# Patient Record
Sex: Male | Born: 1985
Health system: Southern US, Community
[De-identification: ages and names within clinical notes are randomized; demographics above are authoritative.]

## PROBLEM LIST (undated history)

## (undated) DIAGNOSIS — A64 Unspecified sexually transmitted disease: Secondary | ICD-10-CM

## (undated) DIAGNOSIS — B86 Scabies: Secondary | ICD-10-CM

## (undated) HISTORY — PX: SKULL FRACTURE ELEVATION: SHX781

---
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
## 2009-09-25 ENCOUNTER — Emergency Department (HOSPITAL_COMMUNITY): Admission: EM | Admit: 2009-09-25 | Discharge: 2009-09-25 | Payer: Self-pay | Admitting: Family Medicine

## 2009-12-14 ENCOUNTER — Emergency Department (HOSPITAL_COMMUNITY): Admission: EM | Admit: 2009-12-14 | Discharge: 2009-12-14 | Payer: Self-pay | Admitting: Family Medicine

## 2010-03-20 ENCOUNTER — Emergency Department (HOSPITAL_COMMUNITY): Admission: EM | Admit: 2010-03-20 | Discharge: 2010-03-20 | Payer: Self-pay | Admitting: Emergency Medicine

## 2011-01-24 ENCOUNTER — Inpatient Hospital Stay (INDEPENDENT_AMBULATORY_CARE_PROVIDER_SITE_OTHER)
Admission: RE | Admit: 2011-01-24 | Discharge: 2011-01-24 | Disposition: A | Discharge status: Home / Self Care | Payer: Self-pay | Source: Ambulatory Visit | Attending: Family Medicine | Admitting: Family Medicine

## 2011-01-24 DIAGNOSIS — Z0489 Encounter for examination and observation for other specified reasons: Secondary | ICD-10-CM

## 2011-01-24 DIAGNOSIS — R300 Dysuria: Secondary | ICD-10-CM

## 2011-01-25 LAB — GC/CHLAMYDIA PROBE AMP, GENITAL: GC Probe Amp, Genital: NEGATIVE

## 2011-04-05 ENCOUNTER — Inpatient Hospital Stay (INDEPENDENT_AMBULATORY_CARE_PROVIDER_SITE_OTHER)
Admission: RE | Admit: 2011-04-05 | Discharge: 2011-04-05 | Disposition: A | Discharge status: Home / Self Care | Payer: Self-pay | Source: Ambulatory Visit | Attending: Family Medicine | Admitting: Family Medicine

## 2011-04-05 DIAGNOSIS — L259 Unspecified contact dermatitis, unspecified cause: Secondary | ICD-10-CM

## 2011-09-16 ENCOUNTER — Emergency Department (INDEPENDENT_AMBULATORY_CARE_PROVIDER_SITE_OTHER)
Admission: EM | Admit: 2011-09-16 | Discharge: 2011-09-16 | Disposition: A | Discharge status: Home / Self Care | Payer: Self-pay | Source: Home / Self Care | Attending: Emergency Medicine | Admitting: Emergency Medicine

## 2011-09-16 DIAGNOSIS — N342 Other urethritis: Secondary | ICD-10-CM

## 2011-09-16 MED ORDER — AZITHROMYCIN 250 MG PO TABS
ORAL_TABLET | ORAL | Status: AC
  Filled 2011-09-16: qty 4

## 2011-09-16 MED ORDER — CEFTRIAXONE SODIUM 250 MG IJ SOLR
INTRAMUSCULAR | Status: AC
  Filled 2011-09-16: qty 250

## 2011-09-16 MED ORDER — CEFTRIAXONE SODIUM 250 MG IJ SOLR
250.0000 mg | Freq: Once | INTRAMUSCULAR | Status: AC
  Administered 2011-09-16: 250 mg via INTRAMUSCULAR

## 2011-09-16 MED ORDER — AZITHROMYCIN 250 MG PO TABS
1000.0000 mg | ORAL_TABLET | Freq: Once | ORAL | Status: AC
  Administered 2011-09-16: 1000 mg via ORAL

## 2011-09-16 MED ORDER — LIDOCAINE HCL (PF) 1 % IJ SOLN
INTRAMUSCULAR | Status: AC
  Filled 2011-09-16: qty 5

## 2011-09-16 NOTE — ED Provider Notes (Signed)
History     CSN: [id number] Arrival date & time: 09/16/2011  4:35 PM   First MD Initiated Contact with Patient 09/16/11 1537      Chief Complaint  Patient presents with  . Penile Discharge    Pt has penile d/c that started two days ago    (Consider location/radiation/quality/duration/timing/severity/associated sxs/prior treatment) HPI Comments: [name] has had a two-day history of a yellowish urethral discharge. He denies any dysuria or penile pain. He has not had any ulcerations, lesions, or blisters of the penis. He denies any testicular pain, swelling, or mass. He's had no inguinal lymphadenopathy. He denies fever, chills, joint pain, or skin rash. He does have a prior history of STD in the past, he thinks it was Chlamydia. His girlfriend was here a couple days ago and had some testing but he's not sure what that showed.  Patient is a 25 y.o. male presenting with penile discharge.  Penile Discharge Pertinent negatives include no abdominal pain.    History reviewed. No pertinent past medical history.  History reviewed. No pertinent past surgical history.  History reviewed. No pertinent family history.  History  Substance Use Topics  . Smoking status: Smoker, Current Status Unknown  . Smokeless tobacco: Not on file  . Alcohol Use: No      Review of Systems  Constitutional: Negative for fever and chills.  Gastrointestinal: Negative for nausea, vomiting and abdominal pain.  Genitourinary: Positive for discharge. Negative for dysuria, urgency, frequency, hematuria, flank pain, penile swelling, scrotal swelling, genital sores, penile pain and testicular pain.    Allergies  Review of patient's allergies indicates no known allergies.  Home Medications  No current outpatient prescriptions on file.  BP 124/67  Pulse 91  Temp(Src) 98.7 F (37.1 C) (Oral)  Resp 14  SpO2 99%  Physical Exam  Nursing note and vitals reviewed. Constitutional: He appears well-developed and  well-nourished. No distress.  Cardiovascular: Normal rate and regular rhythm.  Exam reveals no gallop and no friction rub.   No murmur heard. Pulmonary/Chest: Effort normal. No respiratory distress. He has no wheezes. He has no rales.  Abdominal: Soft. Bowel sounds are normal. He exhibits no distension and no mass. There is no hepatosplenomegaly. There is no tenderness. There is no rebound, no guarding and no CVA tenderness.  Genitourinary: No penile tenderness.       There is a thick, purulent, yellowish urethral discharge. He had a couple of tiny, nondescript papules on the foreskin, otherwise no genital lesions. No testicular swelling, tenderness, or mass. No inguinal lymphadenopathy.  Skin: He is not diaphoretic.    ED Course  Procedures (including critical care time)  The patient was given the following meds in the [facility]:   Medications  azithromycin (ZITHROMAX) tablet 1,000 mg (1000 mg Oral Given 09/16/11 1718)  cefTRIAXone (ROCEPHIN) injection 250 mg (250 mg Intramuscular Given 09/16/11 1712)     And had the following response:  No allergic reaction.   Labs Reviewed  HIV ANTIBODY (ROUTINE TESTING)  RPR  GC/CHLAMYDIA PROBE AMP, GENITAL   No results found.   1. Urethritis       MDM  This appears to be gonococcal urethritis.        Roque Lias, MD 09/16/11 [phone]

## 2011-09-18 ENCOUNTER — Telehealth (HOSPITAL_COMMUNITY): Payer: Self-pay | Admitting: *Deleted

## 2011-09-18 NOTE — ED Notes (Signed)
[facility] form completed and faxed to [facility]. Dwayne Ramsey 09/19/2011

## 2011-09-19 LAB — GC/CHLAMYDIA PROBE AMP, GENITAL
Chlamydia, DNA Probe: POSITIVE — AB
GC Probe Amp, Genital: POSITIVE — AB

## 2011-09-20 ENCOUNTER — Telehealth (HOSPITAL_COMMUNITY): Payer: Self-pay | Admitting: *Deleted

## 2011-09-20 NOTE — ED Notes (Signed)
[employer]HS forms filled out and faxed to [facility]

## 2013-01-03 ENCOUNTER — Encounter (HOSPITAL_COMMUNITY): Payer: Self-pay | Admitting: Emergency Medicine

## 2013-01-03 ENCOUNTER — Emergency Department (HOSPITAL_COMMUNITY)
Admission: EM | Admit: 2013-01-03 | Discharge: 2013-01-03 | Disposition: A | Discharge status: Home / Self Care | Payer: Self-pay | Attending: Emergency Medicine | Admitting: Emergency Medicine

## 2013-01-03 DIAGNOSIS — Y929 Unspecified place or not applicable: Secondary | ICD-10-CM | POA: Insufficient documentation

## 2013-01-03 DIAGNOSIS — F17200 Nicotine dependence, unspecified, uncomplicated: Secondary | ICD-10-CM | POA: Insufficient documentation

## 2013-01-03 DIAGNOSIS — S39848A Other specified injuries of external genitals, initial encounter: Secondary | ICD-10-CM | POA: Insufficient documentation

## 2013-01-03 DIAGNOSIS — R21 Rash and other nonspecific skin eruption: Secondary | ICD-10-CM | POA: Insufficient documentation

## 2013-01-03 DIAGNOSIS — S3994XA Unspecified injury of external genitals, initial encounter: Secondary | ICD-10-CM | POA: Insufficient documentation

## 2013-01-03 DIAGNOSIS — IMO0002 Reserved for concepts with insufficient information to code with codable children: Secondary | ICD-10-CM | POA: Insufficient documentation

## 2013-01-03 DIAGNOSIS — Y9389 Activity, other specified: Secondary | ICD-10-CM | POA: Insufficient documentation

## 2013-01-03 DIAGNOSIS — T148XXA Other injury of unspecified body region, initial encounter: Secondary | ICD-10-CM

## 2013-01-03 DIAGNOSIS — X58XXXA Exposure to other specified factors, initial encounter: Secondary | ICD-10-CM | POA: Insufficient documentation

## 2013-01-03 DIAGNOSIS — R195 Other fecal abnormalities: Secondary | ICD-10-CM | POA: Insufficient documentation

## 2013-01-03 NOTE — ED Notes (Signed)
PA in room at this time performing assessment  

## 2013-01-03 NOTE — ED Notes (Signed)
Had sex during night and felt some discomfort- this am had increased discomfort when he tried to have sex again . Stated had a scabbed area.  Had no problems before last evening. No blisters, burning, or  discharge.

## 2013-01-03 NOTE — ED Provider Notes (Signed)
History     CSN: [id number]  Arrival date & time 01/03/13  1219   First MD Initiated Contact with Patient 01/03/13 1232      Chief Complaint  Patient presents with  . Penile Discharge    (Consider location/radiation/quality/duration/timing/severity/associated sxs/prior treatment) HPI  Dwayne Ramsey is a 27 y.o. male complaining of lesion to penile shaft noticed this AM. Pt states he had several episodes of vigorous intercourse  And thinks he ripped the skin. He describes a mild discomfort tem the area is palpated but it is otherwise unnoticeable. Denies urethral discharge, abdominal pain, fever.    History reviewed. No pertinent past medical history.  Past Surgical History  Procedure Laterality Date  . Skull fracture elevation      History reviewed. No pertinent family history.  History  Substance Use Topics  . Smoking status: Smoker, Current Status Unknown -- 0.50 packs/day    Types: Cigarettes  . Smokeless tobacco: Not on file  . Alcohol Use: Yes     Comment: occassion      Review of Systems  Constitutional: Negative for fever.  Respiratory: Negative for shortness of breath.   Cardiovascular: Negative for chest pain.  Gastrointestinal: Negative for nausea, vomiting, abdominal pain and diarrhea.  Skin: Positive for rash.  All other systems reviewed and are negative.    Allergies  Review of patient's allergies indicates no known allergies.  Home Medications  No current outpatient prescriptions on file.  BP 122/70  Pulse 76  Temp(Src) 97.4 F (36.3 C) (Oral)  Resp 18  Ht 5\' 7"  (1.702 m)  Wt 150 lb (68.04 kg)  BMI 23.49 kg/m2  SpO2 98%  Physical Exam  Nursing note and vitals reviewed. Constitutional: He is oriented to person, place, and time. He appears well-developed and well-nourished. No distress.  HENT:  Head: Normocephalic.  Eyes: Conjunctivae and EOM are normal.  Cardiovascular: Normal rate.   Pulmonary/Chest: Effort normal and breath  sounds normal. No stridor. No respiratory distress. He has no wheezes. He has no rales. He exhibits no tenderness.  Genitourinary:     0.5 x.75 cm partial thickness abrasion to penile shaft. No erythema, discharge, or induration. Wound is pink and minimally tender.   Musculoskeletal: Normal range of motion.  Lymphadenopathy:       Right: No inguinal adenopathy present.       Left: No inguinal adenopathy present.  Neurological: He is alert and oriented to person, place, and time.  Psychiatric: He has a normal mood and affect.    ED Course  Procedures (including critical care time)  Labs Reviewed - No data to display No results found.   1. Abrasion       MDM   Dwayne Ramsey is a 27 y.o. male with superficial penile abrasion. Advised topical antibiotics. Return precautions for infection or changing lesions.    Filed Vitals:   01/03/13 1242  BP: 122/70  Pulse: 76  Temp: 97.4 F (36.3 C)  TempSrc: Oral  Resp: 18  Height: 5\' 7"  (1.702 m)  Weight: 150 lb (68.04 kg)  SpO2: 98%     Pt verbalized understanding and agrees with care plan. Outpatient follow-up and return precautions given.         Wynetta Emery, PA-C 01/04/13 [phone]

## 2013-01-04 NOTE — ED Provider Notes (Signed)
Medical screening examination/treatment/procedure(s) were performed by non-physician practitioner and as supervising physician I was immediately available for consultation/collaboration.  Nathan R. Pickering, MD 01/04/13 1547 

## 2013-09-04 ENCOUNTER — Emergency Department (HOSPITAL_COMMUNITY)
Admission: EM | Admit: 2013-09-04 | Discharge: 2013-09-04 | Disposition: A | Discharge status: Home / Self Care | Payer: Self-pay | Attending: Emergency Medicine | Admitting: Emergency Medicine

## 2013-09-04 ENCOUNTER — Encounter (HOSPITAL_COMMUNITY): Payer: Self-pay | Admitting: Emergency Medicine

## 2013-09-04 DIAGNOSIS — F17200 Nicotine dependence, unspecified, uncomplicated: Secondary | ICD-10-CM | POA: Insufficient documentation

## 2013-09-04 DIAGNOSIS — B86 Scabies: Secondary | ICD-10-CM | POA: Insufficient documentation

## 2013-09-04 MED ORDER — DIPHENHYDRAMINE HCL 25 MG PO TABS: 25.0000 mg | ORAL_TABLET | Freq: Four times a day (QID) | ORAL | Status: DC

## 2013-09-04 MED ORDER — PERMETHRIN 5 % EX CREA: TOPICAL_CREAM | CUTANEOUS | Status: DC

## 2013-09-04 NOTE — ED Provider Notes (Signed)
Medical screening examination/treatment/procedure(s) were performed by non-physician practitioner and as supervising physician I was immediately available for consultation/collaboration.  EKG Interpretation   None         William Tichina Koebel, MD 09/04/13 1541 

## 2013-09-04 NOTE — ED Notes (Signed)
Pt here with rash to bilateral wrist and hands x 2 weeks that is itching

## 2013-09-04 NOTE — ED Provider Notes (Signed)
CSN: [id number]     Arrival date & time 09/04/13  1224 History  This chart was scribed for non-physician practitioner Fayrene Helper, PA-C working with Dagmar Hait, MD by Leone Payor, ED Scribe. This patient was seen in room TR08C/TR08C and the patient's care was started at 1224.    Chief Complaint  Patient presents with  . Rash    The history is provided by the patient. No language interpreter was used.     HPI Comments: Dwayne Ramsey is a 27 y.o. male who presents to the Emergency Department complaining of 3 weeks of gradual onset, gradually worsening, constant rash to the bilateral hands and forearms. Pt reports having associated itching as well. He states staying in hotel rooms and visiting friends' homes. He has not tried anything for these symptoms. He denies use of new soaps or detergents. He denies new pets or medications.   History reviewed. No pertinent past medical history. Past Surgical History  Procedure Laterality Date  . Skull fracture elevation     History reviewed. No pertinent family history. History  Substance Use Topics  . Smoking status: Smoker, Current Status Unknown -- 0.50 packs/day    Types: Cigarettes  . Smokeless tobacco: Not on file  . Alcohol Use: Yes     Comment: occassion    Review of Systems  HENT: Negative for trouble swallowing.   Respiratory: Negative for shortness of breath.   Skin: Positive for rash.    Allergies  Review of patient's allergies indicates no known allergies.  Home Medications  No current outpatient prescriptions on file. BP 119/71  Pulse 76  Temp(Src) 97.5 F (36.4 C)  SpO2 97% Physical Exam  Nursing note and vitals reviewed. Constitutional: He is oriented to person, place, and time. He appears well-developed and well-nourished.  HENT:  Head: Normocephalic and atraumatic.  No oral mucosa involvement.   Cardiovascular: Normal rate.   Pulmonary/Chest: Effort normal.  Abdominal: He exhibits no distension.   Neurological: He is alert and oriented to person, place, and time.  Skin: Skin is warm and dry.  Several 2mm papillar non-blanchable rash noted in the inter web space of bilateral hands, right wrist and bilateral forearms. Excoriation marks noted. No pustular, vesicular, or petechial rash. No rash involving oral mucosa or palms of hands.   Psychiatric: He has a normal mood and affect.    ED Course  Procedures   DIAGNOSTIC STUDIES: Oxygen Saturation is 97% on RA, normal by my interpretation.    COORDINATION OF CARE: 1:25 PM Discussed with patient that this may be scabies. Advised him to clean all linens and clothes with hot water to ensure destruction of the scabies mites. Discussed treatment plan with pt at bedside and pt agreed to plan.   Labs Review Labs Reviewed - No data to display Imaging Review No results found.  EKG Interpretation   None       MDM   1. Scabies    BP 119/71  Pulse 76  Temp(Src) 97.5 F (36.4 C)  SpO2 97%  I personally performed the services described in this documentation, which was scribed in my presence. The recorded information has been reviewed and is accurate.    Fayrene Helper, PA-C 09/04/13 1333

## 2013-09-12 ENCOUNTER — Encounter (HOSPITAL_COMMUNITY): Payer: Self-pay | Admitting: Emergency Medicine

## 2013-09-12 ENCOUNTER — Emergency Department (HOSPITAL_COMMUNITY)
Admission: EM | Admit: 2013-09-12 | Discharge: 2013-09-12 | Disposition: A | Discharge status: Home / Self Care | Payer: Self-pay | Attending: Emergency Medicine | Admitting: Emergency Medicine

## 2013-09-12 DIAGNOSIS — R21 Rash and other nonspecific skin eruption: Secondary | ICD-10-CM | POA: Insufficient documentation

## 2013-09-12 DIAGNOSIS — Z8619 Personal history of other infectious and parasitic diseases: Secondary | ICD-10-CM | POA: Insufficient documentation

## 2013-09-12 DIAGNOSIS — F17200 Nicotine dependence, unspecified, uncomplicated: Secondary | ICD-10-CM | POA: Insufficient documentation

## 2013-09-12 HISTORY — DX: Scabies: B86

## 2013-09-12 MED ORDER — CEPHALEXIN 500 MG PO CAPS: 500.0000 mg | ORAL_CAPSULE | Freq: Four times a day (QID) | ORAL | Status: DC

## 2013-09-12 MED ORDER — DEXAMETHASONE SODIUM PHOSPHATE 10 MG/ML IJ SOLN
10.0000 mg | Freq: Once | INTRAMUSCULAR | Status: AC
  Administered 2013-09-12: 10 mg via INTRAMUSCULAR
  Filled 2013-09-12: qty 1

## 2013-09-12 NOTE — ED Notes (Signed)
Pt was recently dx with scabies and given px cream; reports he has been using it everyday. Now has multiple bumps on leg and buttock.

## 2013-09-12 NOTE — ED Provider Notes (Signed)
CSN: [id number]     Arrival date & time 09/12/13  1321 History  This chart was scribed for non-physician practitioner, Jamse Mead, PA-C working with Jasper Riling. Alvino Chapel, MD by Roe Coombs, ED Scribe. This patient was seen in room TR10C/TR10C and the patient's care was started at 3:15 PM.    Chief Complaint  Patient presents with  . Rash    The history is provided by the patient. No language interpreter was used.    HPI Comments: Dwayne Ramsey is a 27 y.o. male who presents to the Emergency Department complaining of constant painful non-draining rash to the right upper and medial thigh, abdomen, inguinal area and buttocks onset 2 days ago. Patient has multiple raised, erythematous areas with scabbing to his skin. He describes current pain as severe. He states that pain is exacerbated when his clothes rub against the affected areas. He reports that a few of these bumps formed whiteheads. He denies any itching. He states that he has been applying permethrin cream which was prescribed to him to treat scabies at his last visit. He denies fever, lesions to his hands, or any other symptoms at this time. He is a smoker.    Previous Chart Review Patient was last seen here on 09/04/13 with a 3-week history of a worsening pruritic rash to his bilateral arms and hands. He states that he had been staying with multiple friends and in hotel rooms prior to developing the rash. He was diagnosed with scabies and advised to clean all linens and clothes appropriately to destroy the scabies mites. He was also prescribed permethrin cream to treat rash and Benadryl to relieve itching.   Past Medical History  Diagnosis Date  . Scabies    Past Surgical History  Procedure Laterality Date  . Skull fracture elevation     No family history on file. History  Substance Use Topics  . Smoking status: Smoker, Current Status Unknown -- 0.50 packs/day    Types: Cigarettes  . Smokeless tobacco: Not on file  .  Alcohol Use: Yes     Comment: occassion    Review of Systems  Constitutional: Negative for fever.  Skin: Positive for rash.  All other systems reviewed and are negative.    Allergies  Review of patient's allergies indicates no known allergies.  Home Medications   Current Outpatient Rx  Name  Route  Sig  Dispense  Refill  . diphenhydrAMINE (BENADRYL) 25 MG tablet   Oral   Take 1 tablet (25 mg total) by mouth every 6 (six) hours.   20 tablet   0   . permethrin (ELIMITE) 5 % cream      Apply to entire body from neck down to feet, leave it for 8-10 hrs and then wash it off completely.  Repeat in 1 week if no improvement.   60 g   0   . cephALEXin (KEFLEX) 500 MG capsule   Oral   Take 1 capsule (500 mg total) by mouth 4 (four) times daily.   40 capsule   0    Triage Vitals: BP 136/48  Pulse 95  Temp(Src) 98.2 F (36.8 C) (Oral)  Resp 18  SpO2 100% Physical Exam  Nursing note and vitals reviewed. Constitutional: He is oriented to person, place, and time. He appears well-developed and well-nourished. No distress.  HENT:  Head: Normocephalic and atraumatic.  Mouth/Throat: Oropharynx is clear and moist. No oropharyngeal exudate.  Negative oral lesions noted  Eyes: Conjunctivae and EOM are  normal. Pupils are equal, round, and reactive to light.  Neck: Normal range of motion. Neck supple.  Negative neck stiffness Negative nuchal rigidity Negative cervical LAD  Cardiovascular: Normal rate, regular rhythm and normal heart sounds.  Exam reveals no friction rub.   No murmur heard. Pulses:      Radial pulses are 2+ on the right side, and 2+ on the left side.  Pulmonary/Chest: Effort normal and breath sounds normal. No respiratory distress. He has no wheezes. He has no rales.  Negative respiratory distress Patient able to speak in full sentences without difficulty   Musculoskeletal: Normal range of motion.  Lymphadenopathy:    He has no cervical adenopathy.   Neurological: He is alert and oriented to person, place, and time. He exhibits normal muscle tone. Coordination normal.  Skin: Skin is warm. Rash noted. He is not diaphoretic.  Small scabbed lesions noted to the upper inner aspect of bilateral thighs with erythema surrounding the lesions. Negative pain upon palpation. Negative active drainage. Negative streaking. Negative lesions in the webspaces. Some lesions are small and have pustules at the top - folliculitis.     ED Course  Procedures (including critical care time) DIAGNOSTIC STUDIES: Oxygen Saturation is 100% on room air, normal by my interpretation.    COORDINATION OF CARE: 3:17 PM- Patient informed of current plan for treatment and evaluation and agrees with plan at this time.   Labs Review Labs Reviewed - No data to display Imaging Review No results found.  EKG Interpretation   None       MDM   1. Rash    Medications  dexamethasone (DECADRON) injection 10 mg (10 mg Intramuscular Given 09/12/13 1540)   Filed Vitals:   09/12/13 1333 09/12/13 1558  BP: 136/48 139/89  Pulse: 95 91  Temp: 98.2 F (36.8 C)   TempSrc: Oral   Resp: 18   SpO2: 100% 97%   I personally performed the services described in this documentation, which was scribed in my presence. The recorded information has been reviewed and is accurate.  Patient presenting to the ED with rash to the body. Patient was previous seen last week and diagnosed with scabies.  Alert and oriented. Lungs clear to auscultation bilaterally. Heart rate and rhythm normal. Pulses palpable. Negative lesions noted to the webspaces. Small scabbed lesions localized to the inner upper aspects of bilateral thighs with surrounding erythema - negative active drainage, negative bleeding, negative streaking noted.  Doubt scabies. Doubt erythema multiforme major/minor. Doubt SJS. Possible dermatitis versus folliculitis. Patient stable, afebrile. Discharged patient with antibiotics.  Discussed with patient to continue to monitor symptoms and if symptoms are to worsen or change report back to the ED - strict return instructions given.  Patient agreed to plan of care, understood, all questions answered.   Jamse Mead, PA-C 09/12/13 [phone]

## 2013-09-12 NOTE — ED Notes (Signed)
Pt reports continued itching rash to groin area.

## 2013-09-15 NOTE — ED Provider Notes (Signed)
Medical screening examination/treatment/procedure(s) were performed by non-physician practitioner and as supervising physician I was immediately available for consultation/collaboration.  EKG Interpretation   None        Juliet Rude. Rubin Payor, MD 09/15/13 [phone]

## 2014-12-03 ENCOUNTER — Encounter (HOSPITAL_COMMUNITY): Payer: Self-pay | Admitting: Emergency Medicine

## 2014-12-03 ENCOUNTER — Emergency Department (HOSPITAL_COMMUNITY)
Admission: EM | Admit: 2014-12-03 | Discharge: 2014-12-03 | Disposition: A | Discharge status: Home / Self Care | Payer: Self-pay | Attending: Emergency Medicine | Admitting: Emergency Medicine

## 2014-12-03 DIAGNOSIS — Z792 Long term (current) use of antibiotics: Secondary | ICD-10-CM | POA: Insufficient documentation

## 2014-12-03 DIAGNOSIS — Z8619 Personal history of other infectious and parasitic diseases: Secondary | ICD-10-CM | POA: Insufficient documentation

## 2014-12-03 DIAGNOSIS — R369 Urethral discharge, unspecified: Secondary | ICD-10-CM | POA: Insufficient documentation

## 2014-12-03 DIAGNOSIS — Z202 Contact with and (suspected) exposure to infections with a predominantly sexual mode of transmission: Secondary | ICD-10-CM | POA: Insufficient documentation

## 2014-12-03 DIAGNOSIS — Z720 Tobacco use: Secondary | ICD-10-CM | POA: Insufficient documentation

## 2014-12-03 DIAGNOSIS — Z711 Person with feared health complaint in whom no diagnosis is made: Secondary | ICD-10-CM

## 2014-12-03 MED ORDER — LIDOCAINE HCL (PF) 1 % IJ SOLN
5.0000 mL | Freq: Once | INTRAMUSCULAR | Status: AC
  Administered 2014-12-03: 5 mL
  Filled 2014-12-03: qty 5

## 2014-12-03 MED ORDER — AZITHROMYCIN 250 MG PO TABS
1000.0000 mg | ORAL_TABLET | Freq: Once | ORAL | Status: AC
  Administered 2014-12-03: 1000 mg via ORAL
  Filled 2014-12-03: qty 4

## 2014-12-03 MED ORDER — CEFTRIAXONE SODIUM 250 MG IJ SOLR
250.0000 mg | Freq: Once | INTRAMUSCULAR | Status: AC
  Administered 2014-12-03: 250 mg via INTRAMUSCULAR
  Filled 2014-12-03: qty 250

## 2014-12-03 NOTE — ED Provider Notes (Signed)
CSN: [id number]     Arrival date & time 12/03/14  1904 History  This chart was scribed for non-physician practitioner, Dierdre ForthHannah Sydnei Ohaver, PA-C, working with Richardean Canalavid H Yao, MD, by Bronson CurbJacqueline Melvin, ED Scribe. This patient was seen in room TR10C/TR10C and the patient's care was started at 7:50 PM.     Chief Complaint  Patient presents with  . Penile Discharge    The history is provided by the patient and medical records. No language interpreter was used.     HPI Comments: Dwayne Ramsey is a 29 y.o. male who presents to the Emergency Department for possible STD exposure. Patient reports having unprotected sexual intercourse with a new partner in the last 24 hours and notes associated penile discharge that began today. He also notes subjective fever, but states this is related to a cold and began prior to onset of discharge. He denies testicular pain/swelling, dysuria, abdominal pain, nausea, or vomiting. He reports history of chlamydia and gonorrhea. Patient states his partner has yet to be tested for ans STD, but reports she is to be evaluated tomorrow. He denies history of HIV or syphilis.   Past Medical History  Diagnosis Date  . Scabies    Past Surgical History  Procedure Laterality Date  . Skull fracture elevation     No family history on file. History  Substance Use Topics  . Smoking status: Smoker, Current Status Unknown -- 0.50 packs/day    Types: Cigarettes  . Smokeless tobacco: Not on file  . Alcohol Use: Yes     Comment: occassion    Review of Systems  Constitutional: Negative for fever, diaphoresis, appetite change, fatigue and unexpected weight change.  HENT: Negative for mouth sores.   Eyes: Negative for visual disturbance.  Respiratory: Negative for cough, chest tightness, shortness of breath and wheezing.   Cardiovascular: Negative for chest pain.  Gastrointestinal: Negative for nausea, vomiting, abdominal pain, diarrhea and constipation.  Endocrine: Negative  for polydipsia, polyphagia and polyuria.  Genitourinary: Positive for discharge. Negative for dysuria, urgency, frequency, hematuria, penile swelling, scrotal swelling and testicular pain.  Musculoskeletal: Negative for back pain and neck stiffness.  Skin: Negative for rash.  Allergic/Immunologic: Negative for immunocompromised state.  Neurological: Negative for syncope, light-headedness and headaches.  Hematological: Does not bruise/bleed easily.  Psychiatric/Behavioral: Negative for sleep disturbance. The patient is not nervous/anxious.       Allergies  Review of patient's allergies indicates no known allergies.  Home Medications   Prior to Admission medications   Medication Sig Start Date End Date Taking? Authorizing Provider  cephALEXin (KEFLEX) 500 MG capsule Take 1 capsule (500 mg total) by mouth 4 (four) times daily. 09/12/13   Marissa Sciacca, PA-C  diphenhydrAMINE (BENADRYL) 25 MG tablet Take 1 tablet (25 mg total) by mouth every 6 (six) hours. 09/04/13   Fayrene HelperBowie Tran, PA-C  permethrin (ELIMITE) 5 % cream Apply to entire body from neck down to feet, leave it for 8-10 hrs and then wash it off completely.  Repeat in 1 week if no improvement. 09/04/13   Fayrene HelperBowie Tran, PA-C   Triage Vitals: BP 147/83 mmHg  Pulse 108  Temp(Src) 98.7 F (37.1 C)  Resp 20  SpO2 98%  Physical Exam  Constitutional: He appears well-developed and well-nourished. No distress.  Awake, alert, nontoxic appearance  HENT:  Head: Normocephalic and atraumatic.  Mouth/Throat: Oropharynx is clear and moist. No oropharyngeal exudate.  Eyes: Conjunctivae are normal. No scleral icterus.  Neck: Normal range of motion. Neck supple.  Cardiovascular: Normal rate, regular rhythm and intact distal pulses.   Pulmonary/Chest: Effort normal and breath sounds normal. No respiratory distress. He has no wheezes.  Equal chest expansion  Abdominal: Soft. Bowel sounds are normal. He exhibits no mass. There is no tenderness.  There is no rebound and no guarding. Hernia confirmed negative in the right inguinal area and confirmed negative in the left inguinal area.  Genitourinary: Testes normal. Right testis shows no mass, no swelling and no tenderness. Right testis is descended. Cremasteric reflex is not absent on the right side. Left testis shows no mass, no swelling and no tenderness. Left testis is descended. Cremasteric reflex is not absent on the left side. Circumcised. No phimosis, paraphimosis, hypospadias, penile erythema or penile tenderness. Discharge ( small, clear) found.  Musculoskeletal: Normal range of motion. He exhibits no edema.  Lymphadenopathy:       Right: No inguinal adenopathy present.       Left: No inguinal adenopathy present.  Neurological: He is alert.  Speech is clear and goal oriented Moves extremities without ataxia  Skin: Skin is warm and dry. He is not diaphoretic.  Psychiatric: He has a normal mood and affect.  Nursing note and vitals reviewed.   ED Course  Procedures (including critical care time)  DIAGNOSTIC STUDIES: Oxygen Saturation is 98% on room air, normal by my interpretation.    COORDINATION OF CARE: At 1954 Discussed treatment plan with patient which includes STD screen. Patient agrees.   Labs Review Labs Reviewed  RPR  HIV ANTIBODY (ROUTINE TESTING)  GC/CHLAMYDIA PROBE AMP ([place])    Imaging Review No results found.   EKG Interpretation None      MDM   Final diagnoses:  Penile discharge  Concern about STD in male without diagnosis   Dwayne Ramsey presents with penile discharge and possible exposure to STD.  Patient is afebrile without abdominal tenderness, abdominal pain or painful bowel movements to indicate prostatitis.  No tenderness to palpation of the testes or epididymis to suggest orchitis or epididymitis.  STD cultures obtained including HIV, syphilis, gonorrhea and chlamydia. Patient to be discharged with instructions to follow up  with PCP. Discussed importance of using protection when sexually active. Pt understands that they have GC/Chlamydia cultures pending and that they will need to inform all sexual partners if results return positive. Patient has been treated prophylactically with azithromycin and Rocephin.   I have personally reviewed patient's vitals, nursing note and any pertinent labs or imaging.  I performed an undressed physical exam.    It has been determined that no acute conditions requiring further emergency intervention are present at this time. The patient/guardian have been advised of the diagnosis and plan. I reviewed all labs and imaging including any potential incidental findings. We have discussed signs and symptoms that warrant return to the ED and they are listed in the discharge instructions.    Vital signs are stable at discharge. Tachycardic at triage, but not on physical exam.  BP 147/83 mmHg  Pulse 108  Temp(Src) 98.7 F (37.1 C)  Resp 20  SpO2 98%  I personally performed the services described in this documentation, which was scribed in my presence. The recorded information has been reviewed and is accurate.           Dahlia Client Dequan Kindred, PA-C 12/03/14 2045  Richardean Canal, MD 12/03/14 [phone]

## 2014-12-03 NOTE — Discharge Instructions (Signed)
1. Medications: usual home medications °2. Treatment: rest, drink plenty of fluids, use a condom with every sexual encounter °3. Follow Up: Please followup with your primary doctor in 3 days for discussion of your diagnoses and further evaluation after today's visit; if you do not have a primary care doctor use the resource guide provided to find one; Please return to the ER for worsening symptoms, high fevers or persistent vomiting. ° °You have been tested for HIV, syphilis, chlamydia and gonorrhea.  These results will be available in approximately 3 days.  Please inform all sexual partners if you test positive for any of these diseases. ° ° ° °Emergency Department Resource Guide °1) Find a Doctor and Pay Out of Pocket °Although you won't have to find out who is covered by your insurance plan, it is a good idea to ask around and get recommendations. You will then need to call the office and see if the doctor you have chosen will accept you as a new patient and what types of options they offer for patients who are self-pay. Some doctors offer discounts or will set up payment plans for their patients who do not have insurance, but you will need to ask so you aren't surprised when you get to your appointment. ° °2) Contact Your Local Health Department °Not all health departments have doctors that can see patients for sick visits, but many do, so it is worth a call to see if yours does. If you don't know where your local health department is, you can check in your phone book. The CDC also has a tool to help you locate your state's health department, and many state websites also have listings of all of their local health departments. ° °3) Find a Walk-in Clinic °If your illness is not likely to be very severe or complicated, you may want to try a walk in clinic. These are popping up all over the country in pharmacies, drugstores, and shopping centers. They're usually staffed by nurse practitioners or physician  assistants that have been trained to treat common illnesses and complaints. They're usually fairly quick and inexpensive. However, if you have serious medical issues or chronic medical problems, these are probably not your best option. ° °No Primary Care Doctor: °- Call Health Connect at  832-8000 - they can help you locate a primary care doctor that  accepts your insurance, provides certain services, etc. °- Physician Referral Service- 1-800-533-3463 ° °Chronic Pain Problems: °Organization         Address  Phone   Notes  °[facility]  (336) 297-2271 Patients need to be referred by their primary care doctor.  ° °Medication Assistance: °Organization         Address  Phone   Notes  °[place] [facility] [address] °[place], [place] [address] (336) 641-8030 --Must be a resident of [place] °-- Must have NO insurance coverage whatsoever (no Medicaid/ Medicare, etc.) °-- The pt. MUST have a primary care doctor that directs their care regularly and follows them in the community °  °[facility]  (866) 331-1348   °[employer]  (888) 892-1162   ° °Agencies that provide inexpensive medical care: °Organization         Address  Phone   Notes  °[facility]  (336) 832-8035   ° Internal Medicine    (336) 832-7272   °[facility] [address] °[place], [place] [address] (336)   832-4777   °[facility] [address], °[place] (336) 271-4999   °[facility]    (336) 373-0678   °[facility]    (336) 272-1050   °[facility] ° [address], [place] Phone:  (336) 832-4444, Fax:  (336) 832-4440 Hours of Operation:  9 am - 6 pm, M-F.  Also accepts Medicaid/Medicare and self-pay.  °[facility] ° [address], [place] Phone: (336) 832-3150, Fax: (336) [phone]. Hours of Operation:  8:30 am - 5:30 pm, M-F.  Also accepts  Medicaid and self-pay.  °[facility] [place] [address], [place] Phone: (336) 878-6027   °[facility] [address], [place], [place] (336)723-1848, Ext. 123 Mondays & Thursdays: 7-9 AM.  First 15 patients are seen on a first come, first serve basis. °  ° °Medicaid-accepting [place] Providers: ° °Organization         Address  Phone   Notes  °[facility] [address], [place] (336) 641-2100 Also accepts self-pay patients.  °[facility] [address], [place] ° (336) 856-9996   °[facility] [address], [place] (336) 288-8857   °[facility] [address] [place] [address], [place] (336) 299-7000   °[facility] [address], [place]  ° (336) 373-1557 Only accepts  Access Medicaid patients after they have their name applied to their card.  ° °Self-Pay (no insurance) in [place]: ° °Organization         Address  Phone   Notes  °Sickle Cell Patients, [facility] [address], [place] (336) 832-1970   °[facility] [address], [place] (336) 832-4400   °[facility] ° [address], [place] (336) 992-4800   °[facility]/Dr. Osei-Bonsu ° [address] [place] [address], [place] or [address], [place] (336) 841-8500 Phone number for both [place] and [place] locations is the same.  °[facility] [address], [place] (336) 299-0000   °[facility] [address] [place] [address], [place] or [address] (336) 852-7530 °(336) 878-2260   °[facility] [address], [place] (336) 350-1642, phone; (336) 294-5005, fax Sees patients 1st and 3rd Saturday of every month.  Must not qualify for public or private insurance (i.e. Medicaid, Medicare, [place] Health Choice, Veterans' Benefits) • Household  income should be no more than 200% of the poverty level •The clinic cannot treat you if you are pregnant or think you are pregnant • Sexually transmitted diseases are not treated at the clinic.  ° ° °Dental Care: °Organization         Address  Phone  Notes  °[place] [facility] [address],  (336) 641-6152 Accepts children up to age 21 who are enrolled in Medicaid or [place] Health Choice; pregnant women with a Medicaid card; and children who have applied for Medicaid or [place] Health Choice, but were declined, whose parents can pay a reduced fee at time of service.  °[facility] [place]  [address], [place] (336) 641-7733 Accepts children up to age 21 who are enrolled in Medicaid or  Health Choice; pregnant women with a   Medicaid card; and children who have applied for Medicaid or [place] Health Choice, but were declined, whose parents can pay a reduced fee at time of service.  °[facility] ° [address], [place] (336) 641-4533 Patients are seen by appointment only. Walk-ins are not accepted. [facility] will see patients 18 years of age and older. °Monday - Tuesday (8am-5pm) °Most Wednesdays (8:30-5pm) °$30 per visit, cash only  °[facility] ° [address], [place] (336) 641-4533 Patients are seen by appointment only. Walk-ins are not accepted. [facility] will see patients 18 years of age and older. °One Wednesday Evening (Monthly: Volunteer Based).  $30 per visit, cash only  °[facility]  (919) 537-3737 for adults; Children under age 4, call Graduate Pediatric Dentistry at (919) [phone]. Children aged 4-14, please call (919) [phone] to request a pediatric application. ° Dental services are provided in all areas of dental care including fillings, crowns and bridges, complete and partial dentures, implants, gum  treatment, root canals, and extractions. Preventive care is also provided. Treatment is provided to both adults and children. °Patients are selected via a lottery and there is often a waiting list. °  °[facility] [address], °[place] ° (336) 763-8833 [email or web address] °  °[facility] [address], [place], [place] (336)723-1848, Ext. 123 Second and Fourth Thursday of each month, opens at 6:30 AM; Clinic ends at 9 AM.  Patients are seen on a first-come first-served basis, and a limited number are seen during each clinic.  ° °[facility] ° [address], [place], [place] (336) 723-7904   Eligibility Requirements °You must have lived in [place], [place], or [place] counties for at least the last three months. °  You cannot be eligible for state or federal sponsored healthcare insurance, including Veterans Administration, Medicaid, or Medicare. °  You generally cannot be eligible for healthcare insurance through your employer.  °  How to apply: °Eligibility screenings are held every Tuesday and Wednesday afternoon from 1:00 pm until 4:00 pm. You do not need an appointment for the interview!  °[facility] [address], [place], [place] 336-631-2330   °[facility]  336-342-8273   °[facility]  336-703-3100   °[facility]  336-570-6415   ° °Behavioral Health Resources in the Community: °Intensive Outpatient Programs °Organization         Address  Phone  Notes  °[facility] [address], [place], [place] 336-878-6098   °[facility] [address], [place], [place] 336-832-9800   °[facility] [address], [place], [place] ° 336-882-2125   °[facility] [address],  °, [place] 1-800-853-5163 or 336-641-4981   °Substance Abuse Resources °Organization         Address  Phone  Notes  °[facility]  336-882-2125   °[facility]  336-784-9470   °[facility]  336-285-9073   °[facility]  336-845-3988   °Residential & Outpatient Substance Abuse Program  1-800-659-3381   °Psychological Services °Organization         Address  Phone  Notes  ° Health  336- 832-9600   °[facility]  336- 378-7881   °[facility] [address],  1-800-853-5163 or 336-641-4981   ° °Mobile Crisis Teams °Organization           Address  Phone  Notes  [facility], Mobile Crisis Care Unit  (253)089-5980   [facility]  [address] [place], [place]   [facility] [address] [place] (249)249-7372    Self-Help/Support Groups Organization         Address  Phone             Notes  [place] [place] - variety of support groups  [place] Call for more information  Narcotics Anonymous (NA), [facility] [address], [employer] [place] [place]  2 meetings at this location   Special educational needs teacher         Address  Phone  Notes  [facility] [place],    [place]  1-236-465-4451   [facility]  [address] [place] [address], [place], [place]   [place], [place] (830)401-9842 Admissions: 8am-3pm M-F  [facility] [place] [address]    [place], [place] 151-761-6073   [facility] [address] [place], [place], [place]   [facility] [address]  [place], [place]   [facility] - Intensive Outpatient [address], [place], [place]   [facility] ([place].,  [place], [place] 1-7316549183 or 201-438-0559   [facility] [address], [place], [place] Accepts Medicaid  [facility] [address],  [place] 1-281-729-8089 Substance Abuse/Addiction Treatment   [facility] Organization         Address  Phone  Notes  [facility]  6407101468   Domenic Schwab, PhD [address] [place], [place]   909-847-1489 or 867-309-0760   [place], [place] 269-275-4843   [facility] [address] [place], [place] 609-342-2648 Insurance/Medicaid/sponsorship through [facility] [address]                                    [place], [place] [phone] [place] [address] [place], [place] 339-383-0842    Dr. Adele Schilder  516-089-2527   [facility] Dept. 1) [address], [place] 2) [place] 3)  [address], [place] [phone]  [phone]   [place] (810)[phone] or (941)[phone] (After Hours)

## 2014-12-03 NOTE — ED Notes (Signed)
Pt. reports penile discharge onset today requesting STD screening , denies dysuria / no fever .

## 2014-12-04 LAB — GC/CHLAMYDIA PROBE AMP (~~LOC~~) NOT AT ARMC
CHLAMYDIA, DNA PROBE: POSITIVE — AB
NEISSERIA GONORRHEA: POSITIVE — AB

## 2014-12-05 LAB — RPR: RPR Ser Ql: NONREACTIVE

## 2014-12-07 ENCOUNTER — Telehealth (HOSPITAL_COMMUNITY): Payer: Self-pay

## 2014-12-07 LAB — HIV ANTIBODY (ROUTINE TESTING W REFLEX): HIV Screen 4th Generation wRfx: NONREACTIVE

## 2014-12-07 NOTE — ED Notes (Signed)
Positive for gonorrhea and chlamydia. Treated per protocol. Attempted call x 1

## 2015-01-18 ENCOUNTER — Emergency Department (HOSPITAL_COMMUNITY)
Admission: EM | Admit: 2015-01-18 | Discharge: 2015-01-18 | Disposition: A | Discharge status: Home / Self Care | Payer: Self-pay | Attending: Emergency Medicine | Admitting: Emergency Medicine

## 2015-01-18 ENCOUNTER — Encounter (HOSPITAL_COMMUNITY): Payer: Self-pay | Admitting: Emergency Medicine

## 2015-01-18 DIAGNOSIS — Z720 Tobacco use: Secondary | ICD-10-CM | POA: Insufficient documentation

## 2015-01-18 DIAGNOSIS — A64 Unspecified sexually transmitted disease: Secondary | ICD-10-CM | POA: Insufficient documentation

## 2015-01-18 DIAGNOSIS — Z792 Long term (current) use of antibiotics: Secondary | ICD-10-CM | POA: Insufficient documentation

## 2015-01-18 LAB — GC/CHLAMYDIA PROBE AMP (~~LOC~~) NOT AT ARMC
Chlamydia: NEGATIVE
Neisseria Gonorrhea: POSITIVE — AB

## 2015-01-18 LAB — HIV ANTIBODY (ROUTINE TESTING W REFLEX): HIV SCREEN 4TH GENERATION: NONREACTIVE

## 2015-01-18 MED ORDER — LIDOCAINE HCL (PF) 1 % IJ SOLN
INTRAMUSCULAR | Status: AC
  Administered 2015-01-18: 5 mL
  Filled 2015-01-18: qty 5

## 2015-01-18 MED ORDER — AZITHROMYCIN 250 MG PO TABS
1000.0000 mg | ORAL_TABLET | Freq: Once | ORAL | Status: AC
  Administered 2015-01-18: 1000 mg via ORAL
  Filled 2015-01-18: qty 4

## 2015-01-18 MED ORDER — CEFTRIAXONE SODIUM 250 MG IJ SOLR
250.0000 mg | Freq: Once | INTRAMUSCULAR | Status: AC
  Administered 2015-01-18: 250 mg via INTRAMUSCULAR
  Filled 2015-01-18: qty 250

## 2015-01-18 NOTE — ED Provider Notes (Signed)
CSN: [id number]     Arrival date & time 01/18/15  [id number] History  This chart was scribed for Dwayne CrumbleAdeleke Samanyu Tinnell, MD by Tanda RockersMargaux Venter, ED Scribe. This patient was seen in room A05C/A05C and the patient's care was started at 1:11 AM.    Chief Complaint  Patient presents with  . SEXUALLY TRANSMITTED DISEASE   The history is provided by the patient. No language interpreter was used.     HPI Comments: Dwayne Ramsey is a 29 y.o. male who presents to the Emergency Department requesting STD check. Pt complains of penile discharge that began 2 days ago as well as intermittent dysuria or hematuria. He reports that he had unprotected sex recently but was told by the partner that she had been tested 3 weeks ago and was negative. Pt denies any other symptoms.    Past Medical History  Diagnosis Date  . Scabies    Past Surgical History  Procedure Laterality Date  . Skull fracture elevation     No family history on file. History  Substance Use Topics  . Smoking status: Smoker, Current Status Unknown -- 0.50 packs/day    Types: Cigarettes  . Smokeless tobacco: Not on file  . Alcohol Use: Yes     Comment: occassion    Review of Systems  10 Systems reviewed and all are negative for acute change except as noted in the HPI.    Allergies  Review of patient's allergies indicates no known allergies.  Home Medications   Prior to Admission medications   Medication Sig Start Date End Date Taking? Authorizing Provider  cephALEXin (KEFLEX) 500 MG capsule Take 1 capsule (500 mg total) by mouth 4 (four) times daily. 09/12/13   Marissa Sciacca, PA-C  diphenhydrAMINE (BENADRYL) 25 MG tablet Take 1 tablet (25 mg total) by mouth every 6 (six) hours. 09/04/13   Fayrene HelperBowie Tran, PA-C  permethrin (ELIMITE) 5 % cream Apply to entire body from neck down to feet, leave it for 8-10 hrs and then wash it off completely.  Repeat in 1 week if no improvement. 09/04/13   Fayrene HelperBowie Tran, PA-C   Triage Vitals: BP 123/68 mmHg   Pulse 87  Temp(Src) 98.6 F (37 C) (Oral)  Resp 18  Ht 5\' 8"  (1.727 m)  Wt 150 lb (68.04 kg)  BMI 22.81 kg/m2  SpO2 97%   Physical Exam  Constitutional: He is oriented to person, place, and time. Vital signs are normal. He appears well-developed and well-nourished.  Non-toxic appearance. He does not appear ill. No distress.  HENT:  Head: Normocephalic and atraumatic.  Nose: Nose normal.  Mouth/Throat: Oropharynx is clear and moist. No oropharyngeal exudate.  Eyes: Conjunctivae and EOM are normal. Pupils are equal, round, and reactive to light. No scleral icterus.  Neck: Normal range of motion. Neck supple. No tracheal deviation, no edema, no erythema and normal range of motion present. No thyroid mass and no thyromegaly present.  Cardiovascular: Normal rate, regular rhythm, S1 normal, S2 normal, normal heart sounds, intact distal pulses and normal pulses.  Exam reveals no gallop and no friction rub.   No murmur heard. Pulses:      Radial pulses are 2+ on the right side, and 2+ on the left side.       Dorsalis pedis pulses are 2+ on the right side, and 2+ on the left side.  Pulmonary/Chest: Effort normal and breath sounds normal. No respiratory distress. He has no wheezes. He has no rhonchi. He has no rales.  Abdominal:  Soft. Normal appearance and bowel sounds are normal. He exhibits no distension, no ascites and no mass. There is no hepatosplenomegaly. There is no tenderness. There is no rebound, no guarding and no CVA tenderness.  Genitourinary: Penis normal. No penile tenderness.  No epididymal tenderness  Musculoskeletal: Normal range of motion. He exhibits no edema or tenderness.  Lymphadenopathy:    He has no cervical adenopathy.  Neurological: He is alert and oriented to person, place, and time. He has normal strength. No cranial nerve deficit or sensory deficit.  Skin: Skin is warm, dry and intact. No petechiae and no rash noted. He is not diaphoretic. No erythema. No pallor.   Psychiatric: He has a normal mood and affect. His behavior is normal. Judgment normal.  Nursing note and vitals reviewed.   ED Course  Procedures (including critical care time)  DIAGNOSTIC STUDIES: Oxygen Saturation is 97% on RA, normal by my interpretation.    COORDINATION OF CARE: 1:13 AM-Discussed treatment plan which includes UA, HIV antibody with pt at bedside and pt agreed to plan.   Labs Review Labs Reviewed - No data to display  Imaging Review No results found.   EKG Interpretation None      MDM   Final diagnoses:  None   patient does emergency department for concern for sexual transmitted infection. He states he has had discharge, dysuria as well. He was treated in the emergency department with ceftriaxone and azithromycin. There is no epididymal tenderness on exam. GC and Chlamydia tests were sent in the urine. Patient was advised to have partners treated as well. His vital signs Thelma Barge normal limits and is safe for discharge.  I personally performed the services described in this documentation, which was scribed in my presence. The recorded information has been reviewed and is accurate.     Dwayne Crumble, MD 01/18/15 0130

## 2015-01-18 NOTE — Discharge Instructions (Signed)
Sexually Transmitted Disease Mr. Stasia CavalierSeagraves, you were treated with antibiotics in the emergency department. Follow-up with a primary care physician within 3 days for continued management. Be sure to have partners tested as well. If symptoms worsen come back to the emergency department immediately. Thank you. A sexually transmitted disease (STD) is a disease or infection often passed to another person during sex. However, STDs can be passed through nonsexual ways. An STD can be passed through:  Spit (saliva).  Semen.  Blood.  Mucus from the vagina.  Pee (urine). HOW CAN I LESSEN MY CHANCES OF GETTING AN STD?  Use:  Latex condoms.  Water-soluble lubricants with condoms. Do not use petroleum jelly or oils.  Dental dams. These are small pieces of latex that are used as a barrier during oral sex.  Avoid having more than one sex partner.  Do not have sex with someone who has other sex partners.  Do not have sex with anyone you do not know or who is at high risk for an STD.  Avoid risky sex that can break your skin.  Do not have sex if you have open sores on your mouth or skin.  Avoid drinking too much alcohol or taking illegal drugs. Alcohol and drugs can affect your good judgment.  Avoid oral and anal sex acts.  Get shots (vaccines) for HPV and hepatitis.  If you are at risk of being infected with HIV, it is advised that you take a certain medicine daily to prevent HIV infection. This is called pre-exposure prophylaxis (PrEP). You may be at risk if:  You are a man who has sex with other men (MSM).  You are attracted to the opposite sex (heterosexual) and are having sex with more than one partner.  You take drugs with a needle.  You have sex with someone who has HIV.  Talk with your doctor about if you are at high risk of being infected with HIV. If you begin to take PrEP, get tested for HIV first. Get tested every 3 months for as long as you are taking PrEP. WHAT SHOULD I  DO IF I THINK I HAVE AN STD?  See your doctor.  Tell your sex partner(s) that you have an STD. They should be tested and treated.  Do not have sex until your doctor says it is okay. WHEN SHOULD I GET HELP? Get help right away if:  You have bad belly (abdominal) pain.  You are a man and have puffiness (swelling) or pain in your testicles.  You are a woman and have puffiness in your vagina. Document Released: 11/23/2004 Document Revised: 10/21/2013 Document Reviewed: 04/11/2013 [facility] Patient Information 2015 [employer] [place]. This information is not intended to replace advice given to you by your health care provider. Make sure you discuss any questions you have with your health care provider.

## 2015-01-18 NOTE — ED Notes (Signed)
Pt request STD check.  Reports clear penile drainage x 2 days.  Denies any other symptoms.

## 2015-01-19 ENCOUNTER — Telehealth (HOSPITAL_BASED_OUTPATIENT_CLINIC_OR_DEPARTMENT_OTHER): Payer: Self-pay | Admitting: Emergency Medicine

## 2016-08-20 ENCOUNTER — Encounter (HOSPITAL_COMMUNITY): Payer: Self-pay

## 2016-08-20 ENCOUNTER — Emergency Department (HOSPITAL_COMMUNITY)
Admission: EM | Admit: 2016-08-20 | Discharge: 2016-08-20 | Disposition: A | Discharge status: Home / Self Care | Payer: Self-pay | Attending: Emergency Medicine | Admitting: Emergency Medicine

## 2016-08-20 DIAGNOSIS — R369 Urethral discharge, unspecified: Secondary | ICD-10-CM | POA: Insufficient documentation

## 2016-08-20 DIAGNOSIS — F17210 Nicotine dependence, cigarettes, uncomplicated: Secondary | ICD-10-CM | POA: Insufficient documentation

## 2016-08-20 LAB — URINE MICROSCOPIC-ADD ON: RBC / HPF: NONE SEEN RBC/hpf (ref 0–5)

## 2016-08-20 LAB — URINALYSIS, ROUTINE W REFLEX MICROSCOPIC
BILIRUBIN URINE: NEGATIVE
Glucose, UA: NEGATIVE mg/dL
Hgb urine dipstick: NEGATIVE
Ketones, ur: NEGATIVE mg/dL
NITRITE: NEGATIVE
Protein, ur: NEGATIVE mg/dL
SPECIFIC GRAVITY, URINE: 1.021 (ref 1.005–1.030)
pH: 6.5 (ref 5.0–8.0)

## 2016-08-20 MED ORDER — STERILE WATER FOR INJECTION IJ SOLN
INTRAMUSCULAR | Status: AC
  Administered 2016-08-20: 10 mL
  Filled 2016-08-20: qty 10

## 2016-08-20 MED ORDER — AZITHROMYCIN 250 MG PO TABS
1000.0000 mg | ORAL_TABLET | Freq: Once | ORAL | Status: AC
  Administered 2016-08-20: 1000 mg via ORAL
  Filled 2016-08-20: qty 4

## 2016-08-20 MED ORDER — CEFTRIAXONE SODIUM 250 MG IJ SOLR
250.0000 mg | Freq: Once | INTRAMUSCULAR | Status: AC
  Administered 2016-08-20: 250 mg via INTRAMUSCULAR
  Filled 2016-08-20: qty 250

## 2016-08-20 NOTE — Discharge Instructions (Signed)
Return here as needed.  Follow-up with the health department °

## 2016-08-20 NOTE — ED Triage Notes (Signed)
Patient complains of penile discharge for 2-3 days with mild dysuria. Also concerned with knot to left side of abdomen when he pushes on same, No pain to same

## 2016-08-20 NOTE — ED Notes (Signed)
Declined W/C at D/C and was escorted to lobby by RN. 

## 2016-08-20 NOTE — ED Provider Notes (Signed)
[facility]-EMERGENCY DEPT Provider Note   CSN: [id number] Arrival date & time: 08/20/16  1324  By signing my name below, I, Clovis PuAvnee Patel, attest that this documentation has been prepared under the direction and in the presence of  BoeingChris Debbe Crumble, PA-C. Electronically Signed: Clovis PuAvnee Patel, ED Scribe. 08/20/16. 2:41 PM.   History   Chief Complaint Chief Complaint  Patient presents with  . Penile Discharge    The history is provided by the patient. No language interpreter was used.   HPI Comments:  Dwayne Ramsey is a 30 y.o. male who presents to the Emergency Department complaining of STD exposure. Pt states he recently had unprotected sex and notes associated penile discharge x 2-3 days. He also notes a knot on the left side of his abdomen. Pt denies any pain. No alleviating factors noted. Pt denies any other complaints at this time.   Past Medical History:  Diagnosis Date  . Scabies     There are no active problems to display for this patient.   Past Surgical History:  Procedure Laterality Date  . SKULL FRACTURE ELEVATION      Home Medications    Prior to Admission medications   Medication Sig Start Date End Date Taking? Authorizing Provider  cephALEXin (KEFLEX) 500 MG capsule Take 1 capsule (500 mg total) by mouth 4 (four) times daily. 09/12/13   Marissa Sciacca, PA-C  diphenhydrAMINE (BENADRYL) 25 MG tablet Take 1 tablet (25 mg total) by mouth every 6 (six) hours. 09/04/13   Fayrene HelperBowie Tran, PA-C  permethrin (ELIMITE) 5 % cream Apply to entire body from neck down to feet, leave it for 8-10 hrs and then wash it off completely.  Repeat in 1 week if no improvement. 09/04/13   Fayrene HelperBowie Tran, PA-C    Family History No family history on file.  Social History Social History  Substance Use Topics  . Smoking status: Smoker, Current Status Unknown    Packs/day: 0.50    Types: Cigarettes  . Smokeless tobacco: Not on file  . Alcohol use Yes     Comment: occassion     Allergies     Review of patient's allergies indicates no known allergies.   Review of Systems Review of Systems  Genitourinary: Positive for discharge.  Skin: Negative for rash and wound.     Physical Exam Updated Vital Signs BP 140/77 (BP Location: Right Arm)   Pulse 75   Temp 97.7 F (36.5 C) (Oral)   Resp 18   Ht 5\' 8"  (1.727 m)   Wt 160 lb (72.6 kg)   SpO2 98%   BMI 24.33 kg/m   Physical Exam  Constitutional: He is oriented to person, place, and time. He appears well-developed and well-nourished. No distress.  HENT:  Head: Normocephalic and atraumatic.  Eyes: Pupils are equal, round, and reactive to light.  Neck: Normal range of motion. Neck supple.  Pulmonary/Chest: Effort normal.  Genitourinary: Penis normal.  Genitourinary Comments: Penile discharge. No lesions or rashes.   Neurological: He is alert and oriented to person, place, and time.  Skin: Skin is warm and dry. Capillary refill takes less than 2 seconds.  Nursing note and vitals reviewed.    ED Treatments / Results  DIAGNOSTIC STUDIES:  Oxygen Saturation is 98% on RA, normal by my interpretation.    COORDINATION OF CARE:  2:30 PM Discussed treatment plan with pt at bedside and pt agreed to plan.  Labs (all labs ordered are listed, but only abnormal results are displayed) Labs  Reviewed  URINALYSIS, ROUTINE W REFLEX MICROSCOPIC (NOT AT [facility])    EKG  EKG Interpretation None       Radiology No results found.  Procedures Procedures (including critical care time)  Medications Ordered in ED Medications - No data to display   Initial Impression / Assessment and Plan / ED Course  I have reviewed the triage vital signs and the nursing notes.  Pertinent labs & imaging results that were available during my care of the patient were reviewed by me and considered in my medical decision making (see chart for details).  Clinical Course    Patient treated in the ED for STI with Rocephin and Zithromax  Patient advised to inform and treat all sexual partners.  Pt advised on safe sex practices and understands that they have GC/Chlamydia cultures pending and will result in 2-3 days. HIV and RPR sent. Pt encouraged to follow up at local health department for future STI checks. No concern for prostatitis or epididymitis. Discussed return precautions. Pt appears safe for discharge.    Final Clinical Impressions(s) / ED Diagnoses   Final diagnoses:  None    New Prescriptions New Prescriptions   No medications on file  I personally performed the services described in this documentation, which was scribed in my presence. The recorded information has been reviewed and is accurate.    Charlestine Night, PA-C 08/20/16 1502    Nira Conn, MD 08/21/16 Rickey Primus

## 2016-08-21 LAB — GC/CHLAMYDIA PROBE AMP (~~LOC~~) NOT AT ARMC
Chlamydia: NEGATIVE
Neisseria Gonorrhea: POSITIVE — AB

## 2016-08-22 ENCOUNTER — Telehealth (HOSPITAL_BASED_OUTPATIENT_CLINIC_OR_DEPARTMENT_OTHER): Payer: Self-pay | Admitting: Emergency Medicine

## 2016-09-25 ENCOUNTER — Encounter (HOSPITAL_COMMUNITY): Payer: Self-pay | Admitting: *Deleted

## 2016-09-25 ENCOUNTER — Emergency Department (HOSPITAL_COMMUNITY)
Admission: EM | Admit: 2016-09-25 | Discharge: 2016-09-25 | Disposition: A | Discharge status: Home / Self Care | Payer: Self-pay | Attending: Emergency Medicine | Admitting: Emergency Medicine

## 2016-09-25 DIAGNOSIS — F17210 Nicotine dependence, cigarettes, uncomplicated: Secondary | ICD-10-CM | POA: Insufficient documentation

## 2016-09-25 DIAGNOSIS — Z202 Contact with and (suspected) exposure to infections with a predominantly sexual mode of transmission: Secondary | ICD-10-CM

## 2016-09-25 DIAGNOSIS — K469 Unspecified abdominal hernia without obstruction or gangrene: Secondary | ICD-10-CM

## 2016-09-25 LAB — URINALYSIS, DIPSTICK ONLY
Bilirubin Urine: NEGATIVE
Glucose, UA: NEGATIVE mg/dL
Hgb urine dipstick: NEGATIVE
KETONES UR: NEGATIVE mg/dL
NITRITE: NEGATIVE
PH: 6.5 (ref 5.0–8.0)
Protein, ur: NEGATIVE mg/dL
SPECIFIC GRAVITY, URINE: 1.03 (ref 1.005–1.030)

## 2016-09-25 MED ORDER — LIDOCAINE HCL (PF) 1 % IJ SOLN
INTRAMUSCULAR | Status: AC
Start: 2016-09-25 — End: 2016-09-25
  Administered 2016-09-25: 0.9 mL
  Filled 2016-09-25: qty 5

## 2016-09-25 MED ORDER — CEFTRIAXONE SODIUM 250 MG IJ SOLR
250.0000 mg | Freq: Once | INTRAMUSCULAR | Status: AC
  Administered 2016-09-25: 250 mg via INTRAMUSCULAR
  Filled 2016-09-25: qty 250

## 2016-09-25 MED ORDER — AZITHROMYCIN 250 MG PO TABS
1000.0000 mg | ORAL_TABLET | Freq: Once | ORAL | Status: AC
  Administered 2016-09-25: 1000 mg via ORAL
  Filled 2016-09-25: qty 4

## 2016-09-25 NOTE — ED Triage Notes (Signed)
The main reason pt is here is to be checked for SD.  He was tx for std several weeks ago and took the antibiotics and waited the "usual 7 days" before he had intercourse with a new partner.  However, last night he noticed penile discharge.  While he's here he would also like to have a "bump" on his stomach checked out.  States last time was told possible hernia and he feels it's bigger than last time.

## 2016-09-25 NOTE — ED Provider Notes (Signed)
[facility] Provider Note   CSN: [id number] Arrival date & time: 09/25/16  1753     History   Chief Complaint Chief Complaint  Patient presents with  . Exposure to STD  . Hernia    HPI Doristine SectionJames D Roemer is a 30 y.o. male.  HPI Patient is a 30 year old male with past mental history prior STI's who presents with multiple complaints.. First patient thinks he may have been exposed to a gonorrhea. He reports having a sexual encounter with a male partner 3 days ago when the condom broke. He does not know if she has a sexually transmitted infection. Yesterday he noted whitish discharge from his penis, but was in the shower so he thought it was soap. However, he also noticed whitish discharge today on waking up. Denies burning, dysuria, hematuria, or changes in bowel movements. No fever or chills.  In addition patient is concerned about a small lump he can palpate above his bellybutton. It is nontender, but he feels that is gone bigger. Of note, patient was evaluated for similar symptoms on 08/20/16. At the time, he was told he may have a hernia and he was treated for sexually transmitted infections. He reports he waited 7 days before having another sexual encounter. He does not know if his partners were treated.  Past Medical History:  Diagnosis Date  . Scabies     There are no active problems to display for this patient.   Past Surgical History:  Procedure Laterality Date  . SKULL FRACTURE ELEVATION         Home Medications    Prior to Admission medications   Not on File    Family History No family history on file.  Social History Social History  Substance Use Topics  . Smoking status: Smoker, Current Status Unknown    Packs/day: 0.50    Types: Cigarettes  . Smokeless tobacco: Never Used  . Alcohol use Yes     Comment: occassion     Allergies   Patient has no known allergies.   Review of Systems Review of Systems  Constitutional: Negative for  chills and fever.  HENT: Negative for ear pain and sore throat.   Eyes: Negative for pain and visual disturbance.  Respiratory: Negative for cough and shortness of breath.   Cardiovascular: Negative for chest pain and palpitations.  Gastrointestinal: Negative for abdominal pain, constipation, diarrhea, nausea, rectal pain and vomiting.  Genitourinary: Positive for discharge. Negative for dysuria and hematuria.  Musculoskeletal: Negative for arthralgias and back pain.  Skin: Negative for color change and rash.  Neurological: Negative for seizures and syncope.  All other systems reviewed and are negative.    Physical Exam Updated Vital Signs BP 133/84 (BP Location: Left Arm)   Pulse 81   Temp 98 F (36.7 C) (Oral)   Resp 18   Ht 5\' 7"  (1.702 m)   Wt 72.6 kg   SpO2 98%   BMI 25.06 kg/m   Physical Exam  Abdominal: Hernia confirmed negative in the right inguinal area and confirmed negative in the left inguinal area.  Dime sized hernia just superior to umbilicus, soft, non-tender, easily reducible  Genitourinary: Testes normal and penis normal. Cremasteric reflex is present. Right testis shows no mass, no swelling and no tenderness. Left testis shows no mass, no swelling and no tenderness. Circumcised. No penile erythema or penile tenderness. No discharge found.  Lymphadenopathy: No inguinal adenopathy noted on the right or left side.     ED Treatments /  Results  Labs (all labs ordered are listed, but only abnormal results are displayed) Labs Reviewed  URINALYSIS, DIPSTICK ONLY - Abnormal; Notable for the following:       Result Value   Color, Urine AMBER (*)    Leukocytes, UA SMALL (*)    All other components within normal limits  RPR  HIV ANTIBODY (ROUTINE TESTING)  GC/CHLAMYDIA PROBE AMP ([place]) [facility]    EKG  EKG Interpretation None       Radiology No results found.  Procedures Procedures (including critical care time)  Medications Ordered in  ED Medications  cefTRIAXone (ROCEPHIN) injection 250 mg (250 mg Intramuscular Given 09/25/16 2134)  azithromycin (ZITHROMAX) tablet 1,000 mg (1,000 mg Oral Given 09/25/16 2134)  lidocaine (PF) (XYLOCAINE) 1 % injection (0.9 mLs  Given 09/25/16 2135)     Initial Impression / Assessment and Plan / ED Course  I have reviewed the triage vital signs and the nursing notes.  Pertinent labs & imaging results that were available during my care of the patient were reviewed by me and considered in my medical decision making (see chart for details).  Clinical Course    Patient is a 30 year old male with past medical history previous STI's who presents with multiple complaints described above. On arrival afebrile, VSS. Exam unremarkable. No penile discharge or tenderness. Small reducible hernia above his umbilicus. RPR, HIV and gonorrhea/chlamydia testing were performed. UA dip +small leuks. Patient was treated with IM Rocephin and azithromycin. Advised to not have sexual intercourse for 7 days after treatment. Advised to ask his partner to get testing and treatment as well. [facility]. Referred to general surgery for reevaluation and elective repair of his hernia as needed. Strict return precautions discussed, patient reports understanding and agreed with plan.  Patient care is supervised by Dr. Ethelda ChickJacubowitz, ED attending  Final Clinical Impressions(s) / ED Diagnoses   Final diagnoses:  Hernia of abdominal cavity  Exposure to STD  Possible exposure to STD    New Prescriptions There are no discharge medications for this patient.    Isa RankinAnn B Syeda Prickett, MD 09/26/16 [id number]    Doug SouSam Jacubowitz, MD 09/27/16 [id number]

## 2016-09-25 NOTE — Discharge Instructions (Signed)
Abstain from sexual activity for the next week. Make sure to ask you partner to see her doctor and get treated. Often women can have no symptoms even when they have a sexually transmitted disease.

## 2016-09-25 NOTE — ED Provider Notes (Signed)
Patient complains of swollen area at the periumbilical area immediately superior to the umbilicus which he's had for several months. On exam he is in no distress. Abdomen there is a dime-sized hernia immediately superior to umbilicus which is soft, nontender and easily reducible   Dwayne SouSam Lorra Freeman, MD 09/25/16 2247

## 2016-09-26 LAB — HIV ANTIBODY (ROUTINE TESTING W REFLEX): HIV Screen 4th Generation wRfx: NONREACTIVE

## 2016-09-26 LAB — RPR: RPR: NONREACTIVE

## 2016-09-27 LAB — GC/CHLAMYDIA PROBE AMP (~~LOC~~) NOT AT ARMC
CHLAMYDIA, DNA PROBE: NEGATIVE
Neisseria Gonorrhea: POSITIVE — AB

## 2017-01-12 ENCOUNTER — Ambulatory Visit (HOSPITAL_COMMUNITY)
Admission: EM | Admit: 2017-01-12 | Discharge: 2017-01-12 | Disposition: A | Discharge status: Home / Self Care | Payer: Self-pay | Attending: Internal Medicine | Admitting: Internal Medicine

## 2017-01-12 ENCOUNTER — Encounter (HOSPITAL_COMMUNITY): Payer: Self-pay | Admitting: Emergency Medicine

## 2017-01-12 DIAGNOSIS — R369 Urethral discharge, unspecified: Secondary | ICD-10-CM | POA: Insufficient documentation

## 2017-01-12 DIAGNOSIS — Z202 Contact with and (suspected) exposure to infections with a predominantly sexual mode of transmission: Secondary | ICD-10-CM | POA: Insufficient documentation

## 2017-01-12 DIAGNOSIS — F17210 Nicotine dependence, cigarettes, uncomplicated: Secondary | ICD-10-CM | POA: Insufficient documentation

## 2017-01-12 DIAGNOSIS — Z113 Encounter for screening for infections with a predominantly sexual mode of transmission: Secondary | ICD-10-CM

## 2017-01-12 DIAGNOSIS — Z711 Person with feared health complaint in whom no diagnosis is made: Secondary | ICD-10-CM

## 2017-01-12 LAB — POCT URINALYSIS DIP (DEVICE)
BILIRUBIN URINE: NEGATIVE
Glucose, UA: NEGATIVE mg/dL
Hgb urine dipstick: NEGATIVE
Ketones, ur: NEGATIVE mg/dL
LEUKOCYTES UA: NEGATIVE
NITRITE: NEGATIVE
PH: 6 (ref 5.0–8.0)
Protein, ur: NEGATIVE mg/dL
Specific Gravity, Urine: 1.025 (ref 1.005–1.030)
Urobilinogen, UA: 0.2 mg/dL (ref 0.0–1.0)

## 2017-01-12 MED ORDER — CEFTRIAXONE SODIUM 250 MG IJ SOLR
250.0000 mg | Freq: Once | INTRAMUSCULAR | Status: AC
  Administered 2017-01-12: 250 mg via INTRAMUSCULAR

## 2017-01-12 MED ORDER — AZITHROMYCIN 250 MG PO TABS
1000.0000 mg | ORAL_TABLET | Freq: Once | ORAL | Status: AC
  Administered 2017-01-12: 1000 mg via ORAL

## 2017-01-12 MED ORDER — AZITHROMYCIN 250 MG PO TABS
ORAL_TABLET | ORAL | Status: AC
  Filled 2017-01-12: qty 2

## 2017-01-12 MED ORDER — AZITHROMYCIN 250 MG PO TABS
ORAL_TABLET | ORAL | Status: AC
  Filled 2017-01-12: qty 4

## 2017-01-12 MED ORDER — CEFTRIAXONE SODIUM 250 MG IJ SOLR
INTRAMUSCULAR | Status: AC
  Filled 2017-01-12: qty 250

## 2017-01-12 NOTE — Discharge Instructions (Signed)
Your urine is being tested for various STDs. If any test comes back positive and you need additional testing we will call you and more than likely be able to call in a prescription for you.

## 2017-01-12 NOTE — ED Notes (Signed)
Not  In  Waiting  Room  X  2      Bathroom   Checked  As   Well

## 2017-01-12 NOTE — ED Triage Notes (Signed)
Here for STD check... Reports he had some penile d/c last night  Denies dysuria, n/v/d, fevers  Sexually active ... Does not use condoms  A&O x4.. NAD

## 2017-01-12 NOTE — ED Provider Notes (Signed)
CSN: [id number]     Arrival date & time 01/12/17  1434 History   First MD Initiated Contact with Patient 01/12/17 1639     Chief Complaint  Patient presents with  . Exposure to STD   (Consider location/radiation/quality/duration/timing/severity/associated sxs/prior Treatment) 31 year old male with a rather obvious low fund of knowledge states he is not certain why he is here but wants to get checked. He thinks he might have something. After speaking some words that really did not make sense I asked him specifically if he had a penile discharge he said he was not sure and then changed his mind and said yes. Denies dysuria denies any lesions or sores, pain or swelling anywhere on the genitals. He states he just feels like and believes light he has got somethin.      Past Medical History:  Diagnosis Date  . Scabies    Past Surgical History:  Procedure Laterality Date  . SKULL FRACTURE ELEVATION     History reviewed. No pertinent family history. Social History  Substance Use Topics  . Smoking status: Smoker, Current Status Unknown    Packs/day: 0.50    Types: Cigarettes  . Smokeless tobacco: Never Used  . Alcohol use Yes     Comment: occassion    Review of Systems  Constitutional: Negative.   Gastrointestinal: Negative.   Genitourinary: Positive for discharge. Negative for dysuria, penile swelling, scrotal swelling, testicular pain and urgency.  Neurological: Negative.   All other systems reviewed and are negative.   Allergies  Patient has no known allergies.  Home Medications   Prior to Admission medications   Not on File   Meds Ordered and Administered this Visit   Medications  cefTRIAXone (ROCEPHIN) injection 250 mg (not administered)  azithromycin (ZITHROMAX) tablet 1,000 mg (not administered)    BP 122/76 (BP Location: Right Arm)   Pulse 93   Temp 98.4 F (36.9 C) (Oral)   Resp 20   SpO2 96%  No data found.   Physical Exam  Constitutional: He is  oriented to person, place, and time. He appears well-developed and well-nourished. No distress.  Eyes: EOM are normal.  Neck: Neck supple.  Cardiovascular: Normal rate.   Pulmonary/Chest: Effort normal. No respiratory distress.  Musculoskeletal: He exhibits no edema.  Neurological: He is alert and oriented to person, place, and time. He exhibits normal muscle tone.  Skin: Skin is warm and dry.  Psychiatric: He has a normal mood and affect.  Nursing note and vitals reviewed.   Urgent Care Course     Procedures (including critical care time)  Labs Review Labs Reviewed  POCT URINALYSIS DIP (DEVICE)  URINE CYTOLOGY ANCILLARY ONLY    Imaging Review No results found.   Visual Acuity Review  Right Eye Distance:   Left Eye Distance:   Bilateral Distance:    Right Eye Near:   Left Eye Near:    Bilateral Near:         MDM   1. Concern about STD in male without diagnosis   2. Penile discharge    Your urine is being tested for various STDs. If any test comes back positive and you need additional testing we will call you and more than likely be able to call in a prescription for you. Meds ordered this encounter  Medications  . cefTRIAXone (ROCEPHIN) injection 250 mg  . azithromycin (ZITHROMAX) tablet 1,000 mg       Hayden Rasmussenavid Jeral Zick, NP 01/12/17 1650

## 2017-01-15 LAB — URINE CYTOLOGY ANCILLARY ONLY
Chlamydia: NEGATIVE
Neisseria Gonorrhea: POSITIVE — AB
TRICH (WINDOWPATH): NEGATIVE

## 2017-04-02 ENCOUNTER — Encounter (HOSPITAL_COMMUNITY): Payer: Self-pay | Admitting: Emergency Medicine

## 2017-04-02 ENCOUNTER — Ambulatory Visit (HOSPITAL_COMMUNITY)
Admission: EM | Admit: 2017-04-02 | Discharge: 2017-04-02 | Disposition: A | Discharge status: Home / Self Care | Payer: Self-pay | Attending: Emergency Medicine | Admitting: Emergency Medicine

## 2017-04-02 DIAGNOSIS — R369 Urethral discharge, unspecified: Secondary | ICD-10-CM | POA: Insufficient documentation

## 2017-04-02 DIAGNOSIS — Z202 Contact with and (suspected) exposure to infections with a predominantly sexual mode of transmission: Secondary | ICD-10-CM | POA: Insufficient documentation

## 2017-04-02 DIAGNOSIS — F17210 Nicotine dependence, cigarettes, uncomplicated: Secondary | ICD-10-CM | POA: Insufficient documentation

## 2017-04-02 HISTORY — DX: Unspecified sexually transmitted disease: A64

## 2017-04-02 MED ORDER — AZITHROMYCIN 250 MG PO TABS
ORAL_TABLET | ORAL | Status: AC
  Filled 2017-04-02: qty 4

## 2017-04-02 MED ORDER — CEFTRIAXONE SODIUM 250 MG IJ SOLR
250.0000 mg | Freq: Once | INTRAMUSCULAR | Status: AC
  Administered 2017-04-02: 250 mg via INTRAMUSCULAR

## 2017-04-02 MED ORDER — CEFTRIAXONE SODIUM 250 MG IJ SOLR
INTRAMUSCULAR | Status: AC
  Filled 2017-04-02: qty 250

## 2017-04-02 MED ORDER — AZITHROMYCIN 250 MG PO TABS
1000.0000 mg | ORAL_TABLET | Freq: Once | ORAL | Status: AC
  Administered 2017-04-02: 1000 mg via ORAL

## 2017-04-02 NOTE — ED Triage Notes (Signed)
[facility] with a [facility] of a penile discharge that started today. The patient reported a new sexual partner.

## 2017-04-02 NOTE — ED Notes (Signed)
Dirty and clean urine collected. 

## 2017-04-02 NOTE — ED Provider Notes (Signed)
CSN: [id number]     Arrival date & time 04/02/17  1653 History   None    Chief Complaint  Patient presents with  . Exposure to STD  . Penile Discharge   (Consider location/radiation/quality/duration/timing/severity/associated sxs/prior Treatment) The history is provided by the patient. No language interpreter was used.  Exposure to STD  This is a new problem. The current episode started more than 1 week ago. The problem occurs constantly. The problem has not changed since onset.Nothing aggravates the symptoms. Nothing relieves the symptoms. He has tried nothing for the symptoms. The treatment provided no relief.  Penile Discharge    Pt complains of penile discharge.   Past Medical History:  Diagnosis Date  . Scabies   . STD (male)    Past Surgical History:  Procedure Laterality Date  . SKULL FRACTURE ELEVATION     History reviewed. No pertinent family history. Social History  Substance Use Topics  . Smoking status: Smoker, Current Status Unknown    Packs/day: 0.50    Types: Cigarettes  . Smokeless tobacco: Never Used  . Alcohol use Yes     Comment: occassion    Review of Systems  Genitourinary: Positive for discharge.  All other systems reviewed and are negative.   Allergies  Patient has no known allergies.  Home Medications   Prior to Admission medications   Not on File   Meds Ordered and Administered this Visit   Medications  cefTRIAXone (ROCEPHIN) injection 250 mg (not administered)  azithromycin (ZITHROMAX) tablet 1,000 mg (not administered)    BP 123/67 (BP Location: Right Arm)   Pulse 69   Temp 98.2 F (36.8 C) (Oral)   Resp 16   SpO2 99%  No data found.   Physical Exam  Constitutional: He is oriented to person, place, and time. He appears well-developed and well-nourished.  HENT:  Head: Normocephalic.  Eyes: EOM are normal.  Neck: Normal range of motion.  Cardiovascular: Normal rate and regular rhythm.   Pulmonary/Chest: Effort normal.   Abdominal: Soft. He exhibits no distension.  Musculoskeletal: Normal range of motion.  Neurological: He is alert and oriented to person, place, and time.  Psychiatric: He has a normal mood and affect.  Nursing note and vitals reviewed.   Urgent Care Course     Procedures (including critical care time)  Labs Review Labs Reviewed  URINE CYTOLOGY ANCILLARY ONLY    Imaging Review No results found.   Visual Acuity Review  Right Eye Distance:   Left Eye Distance:   Bilateral Distance:    Right Eye Near:   Left Eye Near:    Bilateral Near:         MDM gc and ct pending.   Pt has had multiple previous stds.  Pt request treatment.   1. Penile discharge   2. STD exposure    An After Visit Summary was printed and given to the patient.     Elson AreasSofia, Leslie K, [place]-C 04/02/17 1901

## 2017-04-03 LAB — URINE CYTOLOGY ANCILLARY ONLY
Chlamydia: NEGATIVE
Neisseria Gonorrhea: POSITIVE — AB

## 2017-06-20 ENCOUNTER — Ambulatory Visit (HOSPITAL_COMMUNITY)
Admission: EM | Admit: 2017-06-20 | Discharge: 2017-06-20 | Disposition: A | Discharge status: Home / Self Care | Payer: Self-pay | Attending: Family Medicine | Admitting: Family Medicine

## 2017-06-20 ENCOUNTER — Encounter (HOSPITAL_COMMUNITY): Payer: Self-pay | Admitting: Emergency Medicine

## 2017-06-20 DIAGNOSIS — L02212 Cutaneous abscess of back [any part, except buttock]: Secondary | ICD-10-CM | POA: Insufficient documentation

## 2017-06-20 DIAGNOSIS — Z113 Encounter for screening for infections with a predominantly sexual mode of transmission: Secondary | ICD-10-CM

## 2017-06-20 DIAGNOSIS — R369 Urethral discharge, unspecified: Secondary | ICD-10-CM | POA: Insufficient documentation

## 2017-06-20 DIAGNOSIS — L0291 Cutaneous abscess, unspecified: Secondary | ICD-10-CM

## 2017-06-20 DIAGNOSIS — A64 Unspecified sexually transmitted disease: Secondary | ICD-10-CM | POA: Insufficient documentation

## 2017-06-20 MED ORDER — AZITHROMYCIN 250 MG PO TABS
ORAL_TABLET | ORAL | Status: AC
  Filled 2017-06-20: qty 4

## 2017-06-20 MED ORDER — STERILE WATER FOR INJECTION IJ SOLN
INTRAMUSCULAR | Status: AC
  Filled 2017-06-20: qty 10

## 2017-06-20 MED ORDER — AZITHROMYCIN 250 MG PO TABS
1000.0000 mg | ORAL_TABLET | Freq: Once | ORAL | Status: AC
  Administered 2017-06-20: 1000 mg via ORAL

## 2017-06-20 MED ORDER — CEFTRIAXONE SODIUM 250 MG IJ SOLR
250.0000 mg | Freq: Once | INTRAMUSCULAR | Status: AC
  Administered 2017-06-20: 250 mg via INTRAMUSCULAR

## 2017-06-20 MED ORDER — CEFTRIAXONE SODIUM 250 MG IJ SOLR
INTRAMUSCULAR | Status: AC
  Filled 2017-06-20: qty 250

## 2017-06-20 MED ORDER — DOXYCYCLINE HYCLATE 100 MG PO CAPS: 100.0000 mg | ORAL_CAPSULE | Freq: Two times a day (BID) | ORAL | 0 refills | Status: DC

## 2017-06-20 NOTE — ED Provider Notes (Signed)
  [facility]   [id number] 06/20/17 Arrival Time: 1931  ASSESSMENT & PLAN:  1. Abscess   2. Urethral discharge in male   3. STD (male)     Meds ordered this encounter  Medications  . cefTRIAXone (ROCEPHIN) injection 250 mg  . azithromycin (ZITHROMAX) tablet 1,000 mg  . doxycycline (VIBRAMYCIN) 100 MG capsule    Sig: Take 1 capsule (100 mg total) by mouth 2 (two) times daily.    Dispense:  20 capsule    Refill:  0    Order Specific Question:   Supervising Provider    Answer:   Mardella Layman [[id number]]   Declines I&D Explained to follow up if worsens.  Reviewed expectations re: course of current medical issues. Questions answered. Outlined signs and symptoms indicating need for more acute intervention. Patient verbalized understanding. After Visit Summary given.   SUBJECTIVE:  Dwayne Ramsey is a 31 y.o. male who presents with complaint of abscess on back and urethral discharge.  ROS: As per HPI.   OBJECTIVE:  Vitals:   06/20/17 2012 06/20/17 2014  BP:  133/85  Pulse:  66  Resp:  16  Temp:  98.8 F (37.1 C)  TempSrc:  Oral  SpO2:  100%  Weight: 160 lb (72.6 kg)   Height: 5\' 7"  (1.702 m)      General appearance: alert; no distress Eyes: PERRLA; EOMI; conjunctiva normal HENT: normocephalic; atraumatic; TMs normal; nasal mucosa normal; oral mucosa normal Neck: supple Lungs: clear to auscultation bilaterally Heart: regular rate and rhythm Abdomen: soft, non-tender; bowel sounds normal; no masses or organomegaly; no guarding or rebound tenderness Back: Right back with 3 cm abscess that is erythematous.   Past Medical History:  Diagnosis Date  . Scabies   . STD (male)      has a past medical history of Scabies and STD (male).  Results for orders placed or performed during the hospital encounter of 04/02/17  Urine cytology ancillary only  Result Value Ref Range   Chlamydia Negative    Neisseria gonorrhea **POSITIVE** (A)     Labs  Reviewed  URINE CYTOLOGY ANCILLARY ONLY    Imaging: No results found.  No Known Allergies  No family history on file. Past Surgical History:  Procedure Laterality Date  . SKULL FRACTURE ELEVATION           Deatra Canter, [place] 06/20/17 2028

## 2017-06-20 NOTE — ED Triage Notes (Signed)
PT reports an abscess on back for 3-4 days.  PT wants an STD check and will not divulge symptoms.

## 2017-06-21 LAB — URINE CYTOLOGY ANCILLARY ONLY
Chlamydia: NEGATIVE
Neisseria Gonorrhea: POSITIVE — AB
Trichomonas: NEGATIVE

## 2017-07-15 ENCOUNTER — Encounter (HOSPITAL_COMMUNITY): Payer: Self-pay | Admitting: Emergency Medicine

## 2017-07-15 ENCOUNTER — Ambulatory Visit (HOSPITAL_COMMUNITY)
Admission: EM | Admit: 2017-07-15 | Discharge: 2017-07-15 | Disposition: A | Discharge status: Home / Self Care | Payer: Self-pay | Attending: Family Medicine | Admitting: Family Medicine

## 2017-07-15 ENCOUNTER — Ambulatory Visit (INDEPENDENT_AMBULATORY_CARE_PROVIDER_SITE_OTHER): Payer: Self-pay

## 2017-07-15 DIAGNOSIS — S62326A Displaced fracture of shaft of fifth metacarpal bone, right hand, initial encounter for closed fracture: Secondary | ICD-10-CM

## 2017-07-15 MED ORDER — MELOXICAM 15 MG PO TABS: 15.0000 mg | ORAL_TABLET | Freq: Every day | ORAL | 0 refills | Status: DC
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
## 2017-07-15 NOTE — Progress Notes (Signed)
Orthopedic Tech Progress Note Patient Details:  Dwayne Ramsey 05-24-1986 [id number]  Ortho Devices Type of Ortho Device: Ace wrap, Ulna gutter splint Ortho Device/Splint Location: RUE Ortho Device/Splint Interventions: Ordered, Application   Jennye Moccasin 07/15/2017, 6:05 PM

## 2017-07-15 NOTE — ED Notes (Signed)
Splint  Applied by  Ortho  tech

## 2017-07-15 NOTE — Discharge Instructions (Signed)
Ulnar gutter splint. Take mobic for pain and inflammation. Follow up with orthopedics for further evaluation and treatment needed. If experiencing swelling of the fingers, numbness/tingling of the fingers, changes in color of the fingers, follow up for reevaluation.

## 2017-07-21 ENCOUNTER — Telehealth (HOSPITAL_COMMUNITY): Payer: Self-pay | Admitting: Emergency Medicine

## 2017-07-21 NOTE — Telephone Encounter (Signed)
Patient asking about pain medicine. Notified pain medicine was sent to [facility]/[place].  Also patient had questions about how long to leave on cast, told patient there are orthopedic follow up numbers on discharge papers.

## 2017-11-03 ENCOUNTER — Encounter (HOSPITAL_COMMUNITY): Payer: Self-pay | Admitting: *Deleted

## 2017-11-03 ENCOUNTER — Other Ambulatory Visit: Payer: Self-pay

## 2017-11-03 ENCOUNTER — Emergency Department (HOSPITAL_COMMUNITY): Payer: Self-pay

## 2017-11-03 ENCOUNTER — Emergency Department (HOSPITAL_COMMUNITY)
Admission: EM | Admit: 2017-11-03 | Discharge: 2017-11-03 | Disposition: A | Discharge status: Home / Self Care | Payer: Self-pay | Attending: Emergency Medicine | Admitting: Emergency Medicine

## 2017-11-03 DIAGNOSIS — Y929 Unspecified place or not applicable: Secondary | ICD-10-CM | POA: Insufficient documentation

## 2017-11-03 DIAGNOSIS — Y998 Other external cause status: Secondary | ICD-10-CM | POA: Insufficient documentation

## 2017-11-03 DIAGNOSIS — Y9389 Activity, other specified: Secondary | ICD-10-CM | POA: Insufficient documentation

## 2017-11-03 DIAGNOSIS — W3400XA Accidental discharge from unspecified firearms or gun, initial encounter: Secondary | ICD-10-CM

## 2017-11-03 DIAGNOSIS — S71132A Puncture wound without foreign body, left thigh, initial encounter: Secondary | ICD-10-CM | POA: Insufficient documentation

## 2017-11-03 DIAGNOSIS — Z23 Encounter for immunization: Secondary | ICD-10-CM | POA: Insufficient documentation

## 2017-11-03 DIAGNOSIS — Y249XXA Unspecified firearm discharge, undetermined intent, initial encounter: Secondary | ICD-10-CM | POA: Insufficient documentation

## 2017-11-03 MED ORDER — CEPHALEXIN 500 MG PO CAPS: 500.0000 mg | ORAL_CAPSULE | Freq: Four times a day (QID) | ORAL | 0 refills | Status: DC

## 2017-11-03 MED ORDER — TETANUS-DIPHTH-ACELL PERTUSSIS 5-2.5-18.5 LF-MCG/0.5 IM SUSP
0.5000 mL | Freq: Once | INTRAMUSCULAR | Status: AC
  Administered 2017-11-03: 0.5 mL via INTRAMUSCULAR

## 2017-11-03 MED ORDER — TETANUS-DIPHTH-ACELL PERTUSSIS 5-2.5-18.5 LF-MCG/0.5 IM SUSP
INTRAMUSCULAR | Status: AC
  Filled 2017-11-03: qty 0.5
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
## 2017-11-03 NOTE — ED Notes (Signed)
Portable lt knee xrays done
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
## 2017-11-03 NOTE — ED Notes (Signed)
Pt verbalized understanding of d/c instructions and haas no further questions. VSS, NAD. Pt d/c home with gf driving.
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
## 2018-04-04 ENCOUNTER — Encounter (HOSPITAL_COMMUNITY): Payer: Self-pay | Admitting: Family Medicine

## 2018-04-04 ENCOUNTER — Ambulatory Visit (HOSPITAL_COMMUNITY)
Admission: EM | Admit: 2018-04-04 | Discharge: 2018-04-04 | Disposition: A | Discharge status: Home / Self Care | Payer: Managed Care, Other (non HMO) | Attending: Physician Assistant | Admitting: Physician Assistant

## 2018-04-04 DIAGNOSIS — Z202 Contact with and (suspected) exposure to infections with a predominantly sexual mode of transmission: Secondary | ICD-10-CM | POA: Insufficient documentation

## 2018-04-04 DIAGNOSIS — Z113 Encounter for screening for infections with a predominantly sexual mode of transmission: Secondary | ICD-10-CM

## 2018-04-04 DIAGNOSIS — F17210 Nicotine dependence, cigarettes, uncomplicated: Secondary | ICD-10-CM | POA: Diagnosis not present

## 2018-04-04 DIAGNOSIS — R69 Illness, unspecified: Secondary | ICD-10-CM | POA: Diagnosis not present

## 2018-04-04 MED ORDER — CEFTRIAXONE SODIUM 250 MG IJ SOLR
INTRAMUSCULAR | Status: AC
  Filled 2018-04-04: qty 250

## 2018-04-04 MED ORDER — AZITHROMYCIN 250 MG PO TABS
1000.0000 mg | ORAL_TABLET | Freq: Once | ORAL | Status: AC
  Administered 2018-04-04: 1000 mg via ORAL

## 2018-04-04 MED ORDER — CEFTRIAXONE SODIUM 250 MG IJ SOLR
250.0000 mg | Freq: Once | INTRAMUSCULAR | Status: AC
  Administered 2018-04-04: 250 mg via INTRAMUSCULAR

## 2018-04-04 MED ORDER — AZITHROMYCIN 250 MG PO TABS
ORAL_TABLET | ORAL | Status: AC
  Filled 2018-04-04: qty 4

## 2018-04-04 MED ORDER — METRONIDAZOLE 500 MG PO TABS: 2000.0000 mg | ORAL_TABLET | Freq: Once | ORAL | 0 refills | Status: AC

## 2018-04-04 NOTE — ED Triage Notes (Signed)
Pt here for exposure to STD. Denies any symptoms.

## 2018-04-04 NOTE — Discharge Instructions (Signed)
You were treated empirically for gonorrhea, chlamydia, trichomonas. Azithromycin 1g by mouth and Rocephin 250mg  injection given in office today. Flagyl as directed. Cytology sent, you will be contacted with any positive results that requires further treatment. Refrain from sexual activity and alcohol use for the next 7 days. Monitor for any worsening of symptoms, fever, abdominal pain, nausea, vomiting, lesion/ulcer/sore on penis, testicle swelling/pain to follow up for reevaluation.
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
## 2018-04-05 ENCOUNTER — Telehealth (HOSPITAL_COMMUNITY): Payer: Self-pay

## 2018-04-05 LAB — URINE CYTOLOGY ANCILLARY ONLY
CHLAMYDIA, DNA PROBE: POSITIVE — AB
NEISSERIA GONORRHEA: NEGATIVE
Trichomonas: NEGATIVE

## 2018-04-05 NOTE — Telephone Encounter (Signed)
Chlamydia is positive.  This was treated at the urgent care visit with po zithromax 1g.  Pt contacted and made aware of results. Educated pt to please refrain from sexual intercourse for 7 days to give the medicine time to work.  Sexual partners need to be notified and tested/treated.  Condoms may reduce risk of reinfection.  Recheck or followup with PCP for further evaluation if symptoms are not improving.  [facility] notified 

## 2018-04-15 ENCOUNTER — Ambulatory Visit (HOSPITAL_COMMUNITY)
Admission: EM | Admit: 2018-04-15 | Discharge: 2018-04-15 | Disposition: A | Discharge status: Home / Self Care | Payer: Managed Care, Other (non HMO) | Attending: Internal Medicine | Admitting: Internal Medicine

## 2018-04-15 ENCOUNTER — Encounter (HOSPITAL_COMMUNITY): Payer: Self-pay

## 2018-04-15 DIAGNOSIS — K429 Umbilical hernia without obstruction or gangrene: Secondary | ICD-10-CM | POA: Diagnosis not present
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
## 2018-04-15 NOTE — ED Triage Notes (Signed)
Pt states he has noticed the knot for a couple of months and just decided to today to get it checked out.

## 2018-04-15 NOTE — Discharge Instructions (Signed)
Please follow up with a primary care provider for recheck of your bp and abdomen.  Reassuring today as no pain and remains soft. If increased pain, fevers or otherwise worsening please return sooner.

## 2018-05-03 ENCOUNTER — Ambulatory Visit (HOSPITAL_COMMUNITY)
Admission: EM | Admit: 2018-05-03 | Discharge: 2018-05-03 | Disposition: A | Discharge status: Home / Self Care | Payer: Managed Care, Other (non HMO) | Attending: Family Medicine | Admitting: Family Medicine

## 2018-05-03 ENCOUNTER — Encounter (HOSPITAL_COMMUNITY): Payer: Self-pay | Admitting: Emergency Medicine

## 2018-05-03 ENCOUNTER — Other Ambulatory Visit: Payer: Self-pay

## 2018-05-03 DIAGNOSIS — Z113 Encounter for screening for infections with a predominantly sexual mode of transmission: Secondary | ICD-10-CM

## 2018-05-03 DIAGNOSIS — Z202 Contact with and (suspected) exposure to infections with a predominantly sexual mode of transmission: Secondary | ICD-10-CM | POA: Diagnosis not present

## 2018-05-03 DIAGNOSIS — F17210 Nicotine dependence, cigarettes, uncomplicated: Secondary | ICD-10-CM | POA: Diagnosis not present

## 2018-05-03 DIAGNOSIS — R69 Illness, unspecified: Secondary | ICD-10-CM | POA: Diagnosis not present

## 2018-05-03 MED ORDER — METRONIDAZOLE 500 MG PO TABS
ORAL_TABLET | ORAL | Status: AC
  Filled 2018-05-03: qty 4

## 2018-05-03 MED ORDER — CEFTRIAXONE SODIUM 250 MG IJ SOLR
250.0000 mg | Freq: Once | INTRAMUSCULAR | Status: AC
  Administered 2018-05-03: 250 mg via INTRAMUSCULAR

## 2018-05-03 MED ORDER — AZITHROMYCIN 250 MG PO TABS
1000.0000 mg | ORAL_TABLET | Freq: Once | ORAL | Status: AC
  Administered 2018-05-03: 1000 mg via ORAL

## 2018-05-03 MED ORDER — AZITHROMYCIN 250 MG PO TABS
ORAL_TABLET | ORAL | Status: AC
  Filled 2018-05-03: qty 4

## 2018-05-03 MED ORDER — LIDOCAINE HCL (PF) 1 % IJ SOLN
INTRAMUSCULAR | Status: AC
  Filled 2018-05-03: qty 2

## 2018-05-03 MED ORDER — METRONIDAZOLE 500 MG PO TABS
2000.0000 mg | ORAL_TABLET | Freq: Once | ORAL | Status: AC
Start: 2018-05-03 — End: 2018-05-03
  Administered 2018-05-03: 2000 mg via ORAL

## 2018-05-03 MED ORDER — CEFTRIAXONE SODIUM 250 MG IJ SOLR
INTRAMUSCULAR | Status: AC
  Filled 2018-05-03: qty 250
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
## 2018-05-03 NOTE — Discharge Instructions (Signed)
You were treated empirically for gonorrhea, chlamydia, trichomonas.  Flagyl 2000 mg by mouth, azithromycin 1g by mouth and Rocephin 250mg  injection given in office today. Cytology sent, you will be contacted with any positive results that requires further treatment. Refrain from sexual activity and alcohol use for the next 7 days. Monitor for any worsening of symptoms, fever, abdominal pain, nausea, vomiting, penile lesion/sore, testicular swelling/pain to follow up for reevaluation.

## 2018-05-03 NOTE — ED Provider Notes (Signed)
[facility]    CSN: [id number] Arrival date & time: 05/03/18  1952     History   Chief Complaint Chief Complaint  Patient presents with  . Exposure to STD    HPI Dwayne Ramsey is a 32 y.o. male.   32 year old male comes in for STD testing due to exposure and wanting empiric treatment for gonorrhea, chlamydia, trichomonas.  He was tested positive for chlamydia few weeks ago and took azithromycin.  However, his partner tested positive for STDs, and he came back in for retesting.  States partner did not tell him what  was positive.  He is asymptomatic.  Denies abdominal pain, nausea, vomiting.  Denies fever, chills, night sweats.  Denies urinary symptoms such as frequency, dysuria, hematuria.  Denies penile discharge, penile sore, testicular swelling or pain.     Past Medical History:  Diagnosis Date  . Scabies   . STD (male)     There are no active problems to display for this patient.   Past Surgical History:  Procedure Laterality Date  . SKULL FRACTURE ELEVATION         Home Medications    Prior to Admission medications   Not on File    Family History Family History  Problem Relation Age of Onset  . Healthy Mother   . Healthy Father     Social History Social History   Tobacco Use  . Smoking status: Smoker, Current Status Unknown    Packs/day: 0.50    Types: Cigarettes  . Smokeless tobacco: Never Used  Substance Use Topics  . Alcohol use: Yes    Comment: occassion  . Drug use: No     Allergies   Patient has no known allergies.   Review of Systems Review of Systems  Reason unable to perform ROS: See HPI as above.     Physical Exam Triage Vital Signs ED Triage Vitals  Enc Vitals Group     BP 05/03/18 1959 (!) 142/85     Pulse Rate 05/03/18 1959 83     Resp 05/03/18 1959 18     Temp 05/03/18 1959 98.1 F (36.7 C)     Temp Source 05/03/18 1959 Oral     SpO2 05/03/18 1959 100 %     Weight --      Height --      Head  Circumference --      Peak Flow --      Pain Score 05/03/18 2000 0     Pain Loc --      Pain Edu? --      Excl. in GC? --    No data found.  Updated Vital Signs BP (!) 142/85 (BP Location: Left Arm)   Pulse 83   Temp 98.1 F (36.7 C) (Oral)   Resp 18   SpO2 100%   Physical Exam  Constitutional: He is oriented to person, place, and time. He appears well-developed and well-nourished. No distress.  HENT:  Head: Normocephalic and atraumatic.  Eyes: Pupils are equal, round, and reactive to light. Conjunctivae are normal.  Neurological: He is alert and oriented to person, place, and time.  Skin: He is not diaphoretic.     UC Treatments / Results  Labs (all labs ordered are listed, but only abnormal results are displayed) Labs Reviewed  URINE CYTOLOGY ANCILLARY ONLY    EKG None  Radiology No results found.  Procedures Procedures (including critical care time)  Medications Ordered in UC Medications  azithromycin (  ZITHROMAX) tablet 1,000 mg (1,000 mg Oral Given 05/03/18 2028)  cefTRIAXone (ROCEPHIN) injection 250 mg (250 mg Intramuscular Given 05/03/18 2028)  metroNIDAZOLE (FLAGYL) tablet 2,000 mg (2,000 mg Oral Given 05/03/18 2028)    Initial Impression / Assessment and Plan / UC Course  I have reviewed the triage vital signs and the nursing notes.  Pertinent labs & imaging results that were available during my care of the patient were reviewed by me and considered in my medical decision making (see chart for details).    Patient was treated empirically for gonorrhea, chlamydia, trichomonas.  Flagyl, azithromycin and Rocephin given in office today. Cytology sent, patient will be contacted with any positive results that require additional treatment. Patient to refrain from sexual activity for the next 7 days. Return precautions given.   Final Clinical Impressions(s) / UC Diagnoses   Final diagnoses:  Exposure to STD    ED Prescriptions    None        Belinda FisherYu, Amy V,  Cordelia Poche-C 05/03/18 2044

## 2018-05-06 LAB — URINE CYTOLOGY ANCILLARY ONLY
CHLAMYDIA, DNA PROBE: NEGATIVE
Neisseria Gonorrhea: NEGATIVE
TRICH (WINDOWPATH): NEGATIVE

## 2018-12-31 ENCOUNTER — Ambulatory Visit (HOSPITAL_COMMUNITY)
Admission: EM | Admit: 2018-12-31 | Discharge: 2018-12-31 | Disposition: A | Discharge status: Home / Self Care | Payer: Self-pay | Attending: Family Medicine | Admitting: Family Medicine

## 2018-12-31 ENCOUNTER — Encounter (HOSPITAL_COMMUNITY): Payer: Self-pay | Admitting: Emergency Medicine

## 2018-12-31 DIAGNOSIS — Z113 Encounter for screening for infections with a predominantly sexual mode of transmission: Secondary | ICD-10-CM

## 2018-12-31 DIAGNOSIS — Z202 Contact with and (suspected) exposure to infections with a predominantly sexual mode of transmission: Secondary | ICD-10-CM | POA: Insufficient documentation

## 2018-12-31 DIAGNOSIS — L74513 Primary focal hyperhidrosis, soles: Secondary | ICD-10-CM

## 2018-12-31 MED ORDER — AZITHROMYCIN 250 MG PO TABS
1000.0000 mg | ORAL_TABLET | Freq: Once | ORAL | Status: AC
  Administered 2018-12-31: 1000 mg via ORAL

## 2018-12-31 MED ORDER — METRONIDAZOLE 500 MG PO TABS
ORAL_TABLET | ORAL | Status: AC
  Filled 2018-12-31: qty 4

## 2018-12-31 MED ORDER — METRONIDAZOLE 500 MG PO TABS
2000.0000 mg | ORAL_TABLET | Freq: Once | ORAL | Status: AC
  Administered 2018-12-31: 2000 mg via ORAL

## 2018-12-31 MED ORDER — AZITHROMYCIN 250 MG PO TABS
ORAL_TABLET | ORAL | Status: AC
  Filled 2018-12-31: qty 4

## 2018-12-31 NOTE — ED Triage Notes (Signed)
Pt states he was notified today that his sexual partner tested postive for trich and chlamydia. No symptoms.

## 2018-12-31 NOTE — ED Provider Notes (Signed)
[facility]    CSN: [id number] Arrival date & time: 12/31/18  1749     History   Chief Complaint Chief Complaint  Patient presents with  . Exposure to STD    HPI Dwayne Ramsey is a 33 y.o. male.   Pt states he was notified today that his sexual partner tested postive for trich and chlamydia. No symptoms.      Past Medical History:  Diagnosis Date  . Scabies   . STD (male)     There are no active problems to display for this patient.   Past Surgical History:  Procedure Laterality Date  . SKULL FRACTURE ELEVATION         Home Medications    Prior to Admission medications   Not on File    Family History Family History  Problem Relation Age of Onset  . Healthy Mother   . Healthy Father     Social History Social History   Tobacco Use  . Smoking status: Smoker, Current Status Unknown    Packs/day: 0.50    Types: Cigarettes  . Smokeless tobacco: Never Used  Substance Use Topics  . Alcohol use: Yes    Comment: occassion  . Drug use: No     Allergies   Patient has no known allergies.   Review of Systems Review of Systems  Constitutional: Positive for diaphoresis. Negative for fever.  Genitourinary: Negative.   All other systems reviewed and are negative.    Physical Exam Triage Vital Signs ED Triage Vitals [12/31/18 1907]  Enc Vitals Group     BP 129/88     Pulse Rate 79     Resp 16     Temp 99.4 F (37.4 C)     Temp src      SpO2 97 %     Weight      Height      Head Circumference      Peak Flow      Pain Score 0     Pain Loc      Pain Edu?      Excl. in GC?    No data found.  Updated Vital Signs BP 129/88   Pulse 79   Temp 99.4 F (37.4 C)   Resp 16   SpO2 97%    Physical Exam Vitals signs and nursing note reviewed.  Constitutional:      Appearance: Normal appearance. He is normal weight.  HENT:     Head: Normocephalic.     Right Ear: External ear normal.     Nose: Nose normal.   Mouth/Throat:     Mouth: Mucous membranes are moist.  Eyes:     Conjunctiva/sclera: Conjunctivae normal.     Pupils: Pupils are equal, round, and reactive to light.  Neck:     Musculoskeletal: Normal range of motion and neck supple. No muscular tenderness.  Cardiovascular:     Pulses: Normal pulses.  Pulmonary:     Effort: Pulmonary effort is normal.     Breath sounds: Normal breath sounds.  Musculoskeletal: Normal range of motion.  Lymphadenopathy:     Cervical: No cervical adenopathy.  Skin:    General: Skin is warm.  Neurological:     General: No focal deficit present.     Mental Status: He is alert.      UC Treatments / Results  Labs (all labs ordered are listed, but only abnormal results are displayed) Labs Reviewed  URINE CYTOLOGY ANCILLARY ONLY  EKG None  Radiology No results found.  Procedures Procedures (including critical care time)  Medications Ordered in UC Medications  azithromycin (ZITHROMAX) tablet 1,000 mg (has no administration in time range)  metroNIDAZOLE (FLAGYL) tablet 2,000 mg (has no administration in time range)    Initial Impression / Assessment and Plan / UC Course  I have reviewed the triage vital signs and the nursing notes.  Pertinent labs & imaging results that were available during my care of the patient were reviewed by me and considered in my medical decision making (see chart for details).    Final Clinical Impressions(s) / UC Diagnoses   Final diagnoses:  STD exposure   Discharge Instructions   None    ED Prescriptions    None     Controlled Substance Prescriptions [place] Controlled Substance Registry consulted? Not Applicable   Elvina Sidle, MD 12/31/18 [phone]

## 2019-01-02 LAB — URINE CYTOLOGY ANCILLARY ONLY
Chlamydia: NEGATIVE
Neisseria Gonorrhea: NEGATIVE
Trichomonas: NEGATIVE

## 2019-11-25 ENCOUNTER — Encounter (HOSPITAL_COMMUNITY): Payer: Self-pay | Admitting: Emergency Medicine

## 2019-11-25 ENCOUNTER — Other Ambulatory Visit: Payer: Self-pay

## 2019-11-25 ENCOUNTER — Ambulatory Visit (HOSPITAL_COMMUNITY)
Admission: EM | Admit: 2019-11-25 | Discharge: 2019-11-25 | Disposition: A | Discharge status: Home / Self Care | Payer: Self-pay | Attending: Family Medicine | Admitting: Family Medicine

## 2019-11-25 DIAGNOSIS — Z202 Contact with and (suspected) exposure to infections with a predominantly sexual mode of transmission: Secondary | ICD-10-CM | POA: Insufficient documentation

## 2019-11-25 DIAGNOSIS — R030 Elevated blood-pressure reading, without diagnosis of hypertension: Secondary | ICD-10-CM | POA: Insufficient documentation

## 2019-11-25 MED ORDER — DOXYCYCLINE HYCLATE 100 MG PO CAPS: 100.0000 mg | ORAL_CAPSULE | Freq: Two times a day (BID) | ORAL | 0 refills | Status: DC

## 2019-11-25 MED ORDER — CEFTRIAXONE SODIUM 500 MG IJ SOLR
INTRAMUSCULAR | Status: AC
  Filled 2019-11-25: qty 500

## 2019-11-25 MED ORDER — CEFTRIAXONE SODIUM 500 MG IJ SOLR
500.0000 mg | Freq: Once | INTRAMUSCULAR | Status: AC
  Administered 2019-11-25: 500 mg via INTRAMUSCULAR

## 2019-11-25 NOTE — ED Provider Notes (Signed)
[facility]   [id number] 11/25/19 Arrival Time: 12  ASSESSMENT & PLAN:  1. Screening for STDs (sexually transmitted diseases)   2. Elevated blood pressure reading without diagnosis of hypertension       Discharge Instructions     You have been given the following today for treatment of suspected gonorrhea and/or chlamydia:  cefTRIAXone (ROCEPHIN) injection 500 mg  Please pick up your prescription for doxycycline 100 mg and begin taking twice daily for the next seven (7) days.  Even though we have treated you today, we have sent testing for sexually transmitted infections. We will notify you of any positive results once they are received. If required, we will prescribe any medications you might need.  Please refrain from all sexual activity for at least the next seven days.  Your blood pressure was noted to be elevated during your visit today. You may return here within the next few days to recheck if unable to see your primary care doctor. If your blood pressure remains persistently elevated, you may need to begin taking a medication.  BP (!) 143/81 (BP Location: Right Arm)   Pulse 77   Temp 98.1 F (36.7 C) (Oral)   Resp 18   SpO2 98%       Pending: Labs Reviewed  CYTOLOGY, (ORAL, ANAL, URETHRAL) ANCILLARY ONLY   Will notify of any positive results. Instructed to refrain from sexual activity for at least seven days.  Recommend: Follow-up Information    Schedule an appointment as soon as possible for a visit  with Primary Care at [facility].   Specialty: Family Medicine Why: To establish care with a primary provider and to recheck your blood pressure. Contact information: [address] [place] [phone]          Reviewed expectations re: course of current medical issues. Questions answered. Outlined signs and symptoms indicating need for more acute intervention. Patient verbalized  understanding. After Visit Summary given.   SUBJECTIVE:  Dwayne Ramsey is a 34 y.o. male who presents with concern over possible exposure to STD. New male partner; unprotected. "Just want to be treated in case." No reported penile discharge. Denies: urinary frequency, dysuria and gross hematuria. Afebrile. No abdominal or pelvic pain. No n/v. No rashes or lesions.  History of STI: yes . Chart review reveals treated gonorrhea 12/2016.  Increased blood pressure noted today. Reports that he has not been treated for hypertension in the past.  He reports no chest pain on exertion, no dyspnea on exertion, no swelling of ankles, no orthostatic dizziness or lightheadedness, no orthopnea or paroxysmal nocturnal dyspnea, no palpitations and no intermittent claudication symptoms.    OBJECTIVE:  Vitals:   11/25/19 1030  BP: (!) 143/81  Pulse: 77  Resp: 18  Temp: 98.1 F (36.7 C)  TempSrc: Oral  SpO2: 98%     General appearance: alert, cooperative, appears stated age and no distress Throat: lips, mucosa, and tongue normal; teeth and gums normal CV: RRR Lungs: CTAB Back: no CVA tenderness; FROM at waist Abdomen: soft, non-tender GU: normal appearing genitalia Skin: warm and dry Psychological: alert and cooperative; normal mood and affect.  Results for orders placed or performed during the hospital encounter of 12/31/18  Urine cytology ancillary only  Result Value Ref Range   Chlamydia Negative    Neisseria Gonorrhea Negative    Trichomonas Negative     Labs Reviewed  CYTOLOGY, (ORAL, ANAL, URETHRAL) ANCILLARY ONLY    No  Known Allergies  Past Medical History:  Diagnosis Date  . Scabies   . STD (male)    Family History  Problem Relation Age of Onset  . Healthy Mother   . Healthy Father    Social History   Socioeconomic History  . Marital status: Single    Spouse name: Not on file  . Number of children: Not on file  . Years of education: Not on file  . Highest  education level: Not on file  Occupational History  . Not on file  Tobacco Use  . Smoking status: Smoker, Current Status Unknown    Packs/day: 0.50    Types: Cigarettes  . Smokeless tobacco: Never Used  Substance and Sexual Activity  . Alcohol use: Yes    Comment: occassion  . Drug use: No  . Sexual activity: Yes  Other Topics Concern  . Not on file  Social History Narrative  . Not on file   Social Determinants of Health   Financial Resource Strain:   . Difficulty of Paying Living Expenses: Not on file  Food Insecurity:   . Worried About Programme researcher, broadcasting/film/video in the Last Year: Not on file  . Ran Out of Food in the Last Year: Not on file  Transportation Needs:   . Lack of Transportation (Medical): Not on file  . Lack of Transportation (Non-Medical): Not on file  Physical Activity:   . Days of Exercise per Week: Not on file  . Minutes of Exercise per Session: Not on file  Stress:   . Feeling of Stress : Not on file  Social Connections:   . Frequency of Communication with Friends and Family: Not on file  . Frequency of Social Gatherings with Friends and Family: Not on file  . Attends Religious Services: Not on file  . Active Member of Clubs or Organizations: Not on file  . Attends Banker Meetings: Not on file  . Marital Status: Not on file  Intimate Partner Violence:   . Fear of Current or Ex-Partner: Not on file  . Emotionally Abused: Not on file  . Physically Abused: Not on file  . Sexually Abused: Not on file          Mardella Layman, MD 11/25/19 1128

## 2019-11-25 NOTE — Discharge Instructions (Addendum)
You have been given the following today for treatment of suspected gonorrhea and/or chlamydia:  cefTRIAXone (ROCEPHIN) injection 500 mg  Please pick up your prescription for doxycycline 100 mg and begin taking twice daily for the next seven (7) days.  Even though we have treated you today, we have sent testing for sexually transmitted infections. We will notify you of any positive results once they are received. If required, we will prescribe any medications you might need.  Please refrain from all sexual activity for at least the next seven days.  Your blood pressure was noted to be elevated during your visit today. You may return here within the next few days to recheck if unable to see your primary care doctor. If your blood pressure remains persistently elevated, you may need to begin taking a medication.  BP (!) 143/81 (BP Location: Right Arm)   Pulse 77   Temp 98.1 F (36.7 C) (Oral)   Resp 18   SpO2 98%

## 2019-11-25 NOTE — ED Triage Notes (Signed)
Pt here for STD screening; pt denies sx  

## 2019-11-26 LAB — CYTOLOGY, (ORAL, ANAL, URETHRAL) ANCILLARY ONLY
Chlamydia: NEGATIVE
Neisseria Gonorrhea: NEGATIVE
Trichomonas: POSITIVE — AB

## 2019-11-28 ENCOUNTER — Telehealth (HOSPITAL_COMMUNITY): Payer: Self-pay | Admitting: Emergency Medicine

## 2019-11-28 MED ORDER — METRONIDAZOLE 500 MG PO TABS: 2000.0000 mg | ORAL_TABLET | Freq: Once | ORAL | 0 refills | Status: AC

## 2019-11-28 NOTE — Telephone Encounter (Signed)
Trichomonas is positive. Rx  for Flagyl 2 grams, once was sent to the pharmacy of record. Pt needs education to refrain from sexual intercourse for 7 days to give the medicine time to work. Sexual partners need to be notified and tested/treated. Condoms may reduce risk of reinfection. Recheck for further evaluation if symptoms are not improving.   Patient contacted by phone and made aware of    results. Pt verbalized understanding and had all questions answered.    

## 2020-07-13 ENCOUNTER — Encounter (HOSPITAL_COMMUNITY): Payer: Self-pay | Admitting: Emergency Medicine

## 2020-07-13 ENCOUNTER — Other Ambulatory Visit: Payer: Self-pay

## 2020-07-13 ENCOUNTER — Ambulatory Visit (HOSPITAL_COMMUNITY)
Admission: EM | Admit: 2020-07-13 | Discharge: 2020-07-13 | Disposition: A | Discharge status: Home / Self Care | Payer: Self-pay | Attending: Family Medicine | Admitting: Family Medicine

## 2020-07-13 DIAGNOSIS — Z711 Person with feared health complaint in whom no diagnosis is made: Secondary | ICD-10-CM | POA: Insufficient documentation

## 2020-07-13 MED ORDER — DOXYCYCLINE HYCLATE 100 MG PO CAPS: 100.0000 mg | ORAL_CAPSULE | Freq: Two times a day (BID) | ORAL | 0 refills | Status: AC

## 2020-07-13 MED ORDER — CEFTRIAXONE SODIUM 500 MG IJ SOLR
500.0000 mg | Freq: Once | INTRAMUSCULAR | Status: AC
  Administered 2020-07-13: 500 mg via INTRAMUSCULAR

## 2020-07-13 MED ORDER — LIDOCAINE HCL (PF) 1 % IJ SOLN
INTRAMUSCULAR | Status: AC
Start: 2020-07-13 — End: ?
  Filled 2020-07-13: qty 2

## 2020-07-13 MED ORDER — CEFTRIAXONE SODIUM 500 MG IJ SOLR
INTRAMUSCULAR | Status: AC
  Filled 2020-07-13: qty 500

## 2020-07-13 NOTE — ED Provider Notes (Signed)
[facility]   [id number] 07/13/20 Arrival Time: 1962  ASSESSMENT & PLAN:  1. Concern about STD in male without diagnosis     Declines HIV/RPR.   Discharge Instructions     You have been given the following today for treatment of suspected gonorrhea and/or chlamydia:  cefTRIAXone (ROCEPHIN) injection 500 mg  Please pick up your prescription for doxycycline 100 mg and begin taking twice daily for the next seven (7) days.  Even though we have treated you today, we have sent testing for sexually transmitted infections. We will notify you of any positive results once they are received. If required, we will prescribe any medications you might need.  Please refrain from all sexual activity for at least the next seven days.     Pending: Labs Reviewed  CYTOLOGY, (ORAL, ANAL, URETHRAL) ANCILLARY ONLY    Will notify of any positive results. Instructed to refrain from sexual activity for at least seven days.  Reviewed expectations re: course of current medical issues. Questions answered. Outlined signs and symptoms indicating need for more acute intervention. Patient verbalized understanding. After Visit Summary given.   SUBJECTIVE:  Dwayne Ramsey is a 34 y.o. male who presents with concern over STD. New male partner. Desires testing and tx. Reports no active penile discharge. Denies: urinary frequency, dysuria and gross hematuria. Afebrile. No abdominal or pelvic pain. No n/v. No rashes or lesions.   OBJECTIVE:  Vitals:   07/13/20 1051  BP: 129/82  Pulse: 74  Resp: 18  Temp: (!) 97.5 F (36.4 C)  TempSrc: Oral  SpO2: 100%     General appearance: alert, cooperative, appears stated age and no distress Throat: lips, mucosa, and tongue normal; teeth and gums normal Lungs: unlabored respirations; speaks full sentences without difficulty Back: no CVA tenderness; FROM at waist Abdomen: soft, non-tender GU: deferred Skin: warm and dry Psychological:  alert and cooperative; normal mood and affect.    Labs Reviewed  CYTOLOGY, (ORAL, ANAL, URETHRAL) ANCILLARY ONLY    No Known Allergies  Past Medical History:  Diagnosis Date  . Scabies   . STD (male)    Family History  Problem Relation Age of Onset  . Healthy Mother   . Healthy Father    Social History   Socioeconomic History  . Marital status: Single    Spouse name: Not on file  . Number of children: Not on file  . Years of education: Not on file  . Highest education level: Not on file  Occupational History  . Not on file  Tobacco Use  . Smoking status: Smoker, Current Status Unknown    Packs/day: 0.25    Types: Cigarettes  . Smokeless tobacco: Never Used  Substance and Sexual Activity  . Alcohol use: Yes    Comment: occassion  . Drug use: No  . Sexual activity: Yes  Other Topics Concern  . Not on file  Social History Narrative  . Not on file   Social Determinants of Health   Financial Resource Strain:   . Difficulty of Paying Living Expenses: Not on file  Food Insecurity:   . Worried About Programme researcher, broadcasting/film/video in the Last Year: Not on file  . Ran Out of Food in the Last Year: Not on file  Transportation Needs:   . Lack of Transportation (Medical): Not on file  . Lack of Transportation (Non-Medical): Not on file  Physical Activity:   . Days of Exercise per Week: Not on file  . Minutes of  Exercise per Session: Not on file  Stress:   . Feeling of Stress : Not on file  Social Connections:   . Frequency of Communication with Friends and Family: Not on file  . Frequency of Social Gatherings with Friends and Family: Not on file  . Attends Religious Services: Not on file  . Active Member of Clubs or Organizations: Not on file  . Attends Banker Meetings: Not on file  . Marital Status: Not on file  Intimate Partner Violence:   . Fear of Current or Ex-Partner: Not on file  . Emotionally Abused: Not on file  . Physically Abused: Not on file  .  Sexually Abused: Not on file          Mardella Layman, MD 07/13/20 1102

## 2020-07-13 NOTE — ED Triage Notes (Signed)
Patient presents to [facility] for assessment after having a new sexual partner.  Would like STD testing.  Denies symptoms.

## 2020-07-13 NOTE — Discharge Instructions (Addendum)

## 2020-07-14 LAB — CYTOLOGY, (ORAL, ANAL, URETHRAL) ANCILLARY ONLY
Chlamydia: NEGATIVE
Comment: NEGATIVE
Comment: NEGATIVE
Comment: NORMAL
Neisseria Gonorrhea: NEGATIVE
Trichomonas: POSITIVE — AB

## 2020-07-15 ENCOUNTER — Telehealth (HOSPITAL_COMMUNITY): Payer: Self-pay | Admitting: Emergency Medicine

## 2020-07-15 NOTE — Progress Notes (Signed)
Patient called and made aware of the need to d/c doxy and start Flagyl for Trichomonas.  Patient upset about the fact that he already paid for and picked up another prescription.  Patient states he does not have money to pay up front for treatment.  This Rn explained that he could return for a one time dose of Flagyl, he would need to check in and to be seen in order to have it ordered.  Patient verbalized understanding and states he will return in the am of 07/16/20

## 2020-07-16 ENCOUNTER — Other Ambulatory Visit: Payer: Self-pay

## 2020-07-16 ENCOUNTER — Ambulatory Visit (HOSPITAL_COMMUNITY)
Admission: EM | Admit: 2020-07-16 | Discharge: 2020-07-16 | Disposition: A | Discharge status: Home / Self Care | Payer: Self-pay | Attending: Family Medicine | Admitting: Family Medicine

## 2020-07-16 MED ORDER — METRONIDAZOLE 500 MG PO TABS
ORAL_TABLET | ORAL | Status: AC
  Filled 2020-07-16: qty 4

## 2020-07-16 MED ORDER — METRONIDAZOLE 500 MG PO TABS
2000.0000 mg | ORAL_TABLET | Freq: Once | ORAL | Status: AC
  Administered 2020-07-16: 2000 mg via ORAL

## 2020-07-16 NOTE — ED Triage Notes (Signed)
Nurse visit. Pt her to get STi tx for trich.

## 2022-12-06 ENCOUNTER — Other Ambulatory Visit: Payer: Self-pay

## 2022-12-06 ENCOUNTER — Encounter (HOSPITAL_COMMUNITY): Payer: Self-pay

## 2022-12-06 ENCOUNTER — Emergency Department (HOSPITAL_COMMUNITY): Payer: Self-pay

## 2022-12-06 ENCOUNTER — Emergency Department (HOSPITAL_COMMUNITY)
Admission: EM | Admit: 2022-12-06 | Discharge: 2022-12-06 | Disposition: A | Discharge status: Home / Self Care | Payer: Self-pay | Attending: Emergency Medicine | Admitting: Emergency Medicine

## 2022-12-06 DIAGNOSIS — K429 Umbilical hernia without obstruction or gangrene: Secondary | ICD-10-CM | POA: Insufficient documentation

## 2022-12-06 LAB — CBC WITH DIFFERENTIAL/PLATELET
Abs Immature Granulocytes: 0.02 10*3/uL (ref 0.00–0.07)
Basophils Absolute: 0.1 10*3/uL (ref 0.0–0.1)
Basophils Relative: 1 %
Eosinophils Absolute: 0.3 10*3/uL (ref 0.0–0.5)
Eosinophils Relative: 5 %
HCT: 44.4 % (ref 39.0–52.0)
Hemoglobin: 15.2 g/dL (ref 13.0–17.0)
Immature Granulocytes: 0 %
Lymphocytes Relative: 24 %
Lymphs Abs: 1.4 10*3/uL (ref 0.7–4.0)
MCH: 31.7 pg (ref 26.0–34.0)
MCHC: 34.2 g/dL (ref 30.0–36.0)
MCV: 92.7 fL (ref 80.0–100.0)
Monocytes Absolute: 0.5 10*3/uL (ref 0.1–1.0)
Monocytes Relative: 8 %
Neutro Abs: 3.7 10*3/uL (ref 1.7–7.7)
Neutrophils Relative %: 62 %
Platelets: 271 10*3/uL (ref 150–400)
RBC: 4.79 MIL/uL (ref 4.22–5.81)
RDW: 13.2 % (ref 11.5–15.5)
WBC: 5.9 10*3/uL (ref 4.0–10.5)
nRBC: 0 % (ref 0.0–0.2)

## 2022-12-06 LAB — COMPREHENSIVE METABOLIC PANEL
ALT: 26 U/L (ref 0–44)
AST: 32 U/L (ref 15–41)
Albumin: 4.2 g/dL (ref 3.5–5.0)
Alkaline Phosphatase: 76 U/L (ref 38–126)
Anion gap: 12 (ref 5–15)
BUN: 15 mg/dL (ref 6–20)
CO2: 23 mmol/L (ref 22–32)
Calcium: 9.1 mg/dL (ref 8.9–10.3)
Chloride: 101 mmol/L (ref 98–111)
Creatinine, Ser: 1.14 mg/dL (ref 0.61–1.24)
GFR, Estimated: >60 mL/min (ref >60)
Glucose, Bld: 156 mg/dL — ABNORMAL HIGH (ref 70–99)
Potassium: 3.8 mmol/L (ref 3.5–5.1)
Sodium: 136 mmol/L (ref 135–145)
Total Bilirubin: 0.2 mg/dL — ABNORMAL LOW (ref 0.3–1.2)
Total Protein: 7.2 g/dL (ref 6.5–8.1)

## 2022-12-06 LAB — LIPASE, BLOOD: Lipase: 35 U/L (ref 11–51)

## 2022-12-06 NOTE — ED Triage Notes (Signed)
Reports having pain around umbilicus that started today but has quit hurting when he arrived.  Patient was eating fries upon walking into triage room. Advised to not eat or drink anything anymore.

## 2022-12-06 NOTE — ED Provider Triage Note (Signed)
Emergency Medicine Provider Triage Evaluation Note  Dwayne Ramsey , a 37 y.o. male  was evaluated in triage.  Pt complains of painful hernia.  He noticed a hernia within the last 24 hours just above his umbilicus.  He states it was much more painful earlier but has improved.  It is worse with palpation.  He denies nausea or vomiting.  He denies a previous history of the same.  Review of Systems  Positive: Swelling above the umbilicus Negative: Fever  Physical Exam  BP (!) 128/116 (BP Location: Right Arm)   Pulse 92   Temp 98.1 F (36.7 C) (Oral)   Resp 16   Ht 5' 7"$  (1.702 m)   Wt 68 kg   SpO2 100%   BMI 23.49 kg/m  Gen:   Awake, no distress   Resp:  Normal effort  MSK:   Moves extremities without difficulty  Other:  Tender to palpation above the umbilicus with firm swelling noted  Medical Decision Making  Medically screening exam initiated at 12:55 PM.  Appropriate orders placed.  Dwayne Ramsey was informed that the remainder of the evaluation will be completed by another provider, this initial triage assessment does not replace that evaluation, and the importance of remaining in the ED until their evaluation is complete.     Margarita Mail, PA-C 12/06/22 1256

## 2022-12-06 NOTE — ED Provider Notes (Signed)
[facility] Provider Note   CSN: [id number] Arrival date & time: 12/06/22  1220     History {Add pertinent medical, surgical, social history, OB history to HPI:1} Chief Complaint  Patient presents with   Umbilical Hernia    Dwayne Ramsey is a 37 y.o. male.  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 Authorizing Provider  doxycycline (VIBRAMYCIN) 100 MG capsule Take 1 capsule (100 mg total) by mouth 2 (two) times daily. 07/13/20   Vanessa Kick, MD      Allergies    Patient has no known allergies.    Review of Systems   Review of Systems  Physical Exam Updated Vital Signs BP (!) 146/90   Pulse 77   Temp 98.5 F (36.9 C)   Resp 17   Ht 5\' 7"  (1.702 m)   Wt 68 kg   SpO2 100%   BMI 23.49 kg/m  Physical Exam  ED Results / Procedures / Treatments   Labs (all labs ordered are listed, but only abnormal results are displayed) Labs Reviewed  COMPREHENSIVE METABOLIC PANEL - Abnormal; Notable for the following components:      Result Value   Glucose, Bld 156 (*)    Total Bilirubin 0.2 (*)    All other components within normal limits  CBC WITH DIFFERENTIAL/PLATELET  LIPASE, BLOOD    EKG None  Radiology CT ABDOMEN PELVIS WO CONTRAST  Result Date: 12/06/2022 CLINICAL DATA:  Suspected periumbilical hernia EXAM: CT ABDOMEN AND PELVIS WITHOUT CONTRAST TECHNIQUE: Multidetector CT imaging of the abdomen and pelvis was performed following the standard protocol without IV contrast. RADIATION DOSE REDUCTION: This exam was performed according to the departmental dose-optimization program which includes automated exposure control, adjustment of the mA and/or kV according to patient size and/or use of iterative reconstruction technique. COMPARISON:  None Available. FINDINGS: Lower chest: No focal  consolidation or pulmonary nodule in the lung bases. No pleural effusion or pneumothorax demonstrated. Partially imaged heart size is normal. Hepatobiliary: No focal hepatic lesions. No intra or extrahepatic biliary ductal dilation. Normal gallbladder. Pancreas: No focal lesions or main ductal dilation. Spleen: Normal in size without focal abnormality. Adrenals/Urinary Tract: No adrenal nodules. Atrophic left kidney contains peripherally calcified. The right kidney appears hypertrophic. No hydronephrosis or nephrolithiasis. No suspicious right renal masses. No focal bladder wall thickening. Stomach/Bowel: Normal appearance of the stomach. No evidence of bowel wall thickening, distention, or inflammatory changes. Normal appendix. Vascular/Lymphatic: No significant vascular findings are present. No enlarged abdominal or pelvic lymph nodes. Reproductive: Prostate is unremarkable. Other: No free fluid, fluid collection, or free air. Musculoskeletal: No acute or abnormal lytic or blastic osseous lesions. Small fat-containing supraumbilical and paraumbilical hernias. IMPRESSION: 1. No acute abdominopelvic abnormality. 2. Small fat-containing supraumbilical and paraumbilical hernias. 3. Atrophic left kidney with compensatory hypertrophy of the right kidney. Electronically Signed   By: Darrin Nipper M.D.   On: 12/06/2022 13:39    Procedures Procedures  {Document cardiac monitor, telemetry assessment procedure when appropriate:1}  Medications Ordered in ED Medications - No data to display  ED Course/ Medical Decision Making/ A&P   {   Click here for ABCD2, HEART and other calculatorsREFRESH Note before signing :1}  Medical Decision Making  ***  {Document critical care time when appropriate:1} {Document review of labs and clinical decision tools ie heart score, Chads2Vasc2 etc:1}  {Document your independent review of radiology images, and any outside records:1} {Document your  discussion with family members, caretakers, and with consultants:1} {Document social determinants of health affecting pt's care:1} {Document your decision making why or why not admission, treatments were needed:1} Final Clinical Impression(s) / ED Diagnoses Final diagnoses:  None    Rx / DC Orders ED Discharge Orders     None
